# Patient Record
Sex: Female | Born: 1980
Health system: Southern US, Community
[De-identification: ages and names within clinical notes are randomized; demographics above are authoritative.]

## PROBLEM LIST (undated history)

## (undated) DIAGNOSIS — F329 Major depressive disorder, single episode, unspecified: Secondary | ICD-10-CM

## (undated) DIAGNOSIS — F32A Depression, unspecified: Secondary | ICD-10-CM

## (undated) DIAGNOSIS — F419 Anxiety disorder, unspecified: Secondary | ICD-10-CM

## (undated) DIAGNOSIS — R000 Tachycardia, unspecified: Secondary | ICD-10-CM

## (undated) HISTORY — DX: Anxiety disorder, unspecified: F41.9

## (undated) HISTORY — DX: Depression, unspecified: F32.A

## (undated) HISTORY — DX: Tachycardia, unspecified: R00.0

## (undated) HISTORY — DX: Major depressive disorder, single episode, unspecified: F32.9

---

## 2015-11-23 DIAGNOSIS — D1803 Hemangioma of intra-abdominal structures: Secondary | ICD-10-CM

## 2015-11-23 HISTORY — DX: Hemangioma of intra-abdominal structures: D18.03

## 2016-08-12 ENCOUNTER — Encounter: Payer: Self-pay | Admitting: Gastroenterology

## 2016-08-24 ENCOUNTER — Other Ambulatory Visit: Payer: Self-pay

## 2016-08-24 ENCOUNTER — Ambulatory Visit (INDEPENDENT_AMBULATORY_CARE_PROVIDER_SITE_OTHER): Payer: PRIVATE HEALTH INSURANCE | Admitting: Nurse Practitioner

## 2016-08-24 ENCOUNTER — Encounter: Payer: Self-pay | Admitting: Nurse Practitioner

## 2016-08-24 DIAGNOSIS — R197 Diarrhea, unspecified: Secondary | ICD-10-CM | POA: Diagnosis not present

## 2016-08-24 DIAGNOSIS — R1084 Generalized abdominal pain: Secondary | ICD-10-CM

## 2016-08-24 DIAGNOSIS — R112 Nausea with vomiting, unspecified: Secondary | ICD-10-CM | POA: Insufficient documentation

## 2016-08-24 DIAGNOSIS — K625 Hemorrhage of anus and rectum: Secondary | ICD-10-CM

## 2016-08-24 DIAGNOSIS — R109 Unspecified abdominal pain: Secondary | ICD-10-CM | POA: Insufficient documentation

## 2016-08-24 MED ORDER — PEG 3350-KCL-NA BICARB-NACL 420 G PO SOLR: 4000.0000 mL | ORAL | 0 refills | Status: DC

## 2016-08-24 NOTE — Patient Instructions (Signed)
1. We will schedule your procedure for you. 2. We will request your records from [facility]. 3. Return for follow-up in 2 months. 4. Further recommendations to be based on the results of your colonoscopy.

## 2016-08-24 NOTE — Patient Instructions (Signed)
No PA needed for TCS, ref# [id number]

## 2016-08-24 NOTE — Progress Notes (Signed)
Primary Care Physician:  Monico Blitz, MD Primary Gastroenterologist:  Dr. Oneida Alar  Chief Complaint  Patient presents with  . Abdominal Pain    HPI:   Deborah Frost is a 35 y.o. female who presents for referral from primary care for abdominal pain. The patient last saw primary care 08/11/2016 at which point she noted a complaint of vomiting which had a sudden onset and had been occurring for 4 days. This is associated with abdominal pain and diarrhea. Review of systems: Pertinent positives of fatigue and fever as well. In her office she had 100 fever. No prior records and our system.  Today she states she is improved. She was admitted to [facility] and told CT scan didn't show anything. She left the hospital. She had a normal bowel mvoement Tues/Wed last week. Ate dinner a couple days later and had a loose stool and vomiting for one episode which resolved. This cycle has been occurring intermittently for the past several years, typically lasts 2-3 days. This time it has lasted much longer. Her abdominal pain is typically epigastric or bilateral side. Denies hematemesis. Has had some blood in her stools over the past couple years, but not during this current symptomatic episode. Last noted episode was 2 days of bloody stools in 11/2015. States she has had a liver hemangioma on CT. When she is not symptomatic episode she has a bowel movement daily, consistent with [place] 4 and feels like she empties completed. Has noted some weight loss as well (7 lbs objectively in the past 2 weeks based on PCP notes.) Energy level is poor "I never have any energy." Denies chest pain, dyspnea, dizziness, lightheadedness, syncope, near syncope. Denies any other upper or lower GI symptoms.   Denies NSAIDs, ASA powders. Never had a colonoscopy or EGD previously.  Past Medical History:  Diagnosis Date  . Anxiety and depression     No past surgical history on file.  Current Outpatient  Prescriptions  Medication Sig Dispense Refill  . escitalopram (LEXAPRO) 10 MG tablet   12   No current facility-administered medications for this visit.     Allergies as of 08/24/2016  . (No Known Allergies)    Family History  Problem Relation Age of Onset  . Colon cancer Neg Hx   . Inflammatory bowel disease Neg Hx     Social History   Social History  . Marital status: Significant Other    Spouse name: N/A  . Number of children: N/A  . Years of education: N/A   Occupational History  . Not on file.   Social History Main Topics  . Smoking status: Former Smoker    Types: Cigarettes    Start date: 08/17/2016    Quit date: 08/17/2016  . Smokeless tobacco: Never Used  . Alcohol use Yes     Comment: One drink every 2 weeks  . Drug use: No  . Sexual activity: Not on file   Other Topics Concern  . Not on file   Social History Narrative  . No narrative on file    Review of Systems: Complete ROS negative except as per HPI.    Physical Exam: BP 112/74   Pulse 77   Temp 98.7 F (37.1 C) (Oral)   Ht 5\' 6"  (1.676 m)   Wt 128 lb (58.1 kg)   LMP 08/13/2016   BMI 20.66 kg/m  General:   Alert and oriented. Pleasant and cooperative. Well-nourished and well-developed.  Head:  Normocephalic and atraumatic.  Eyes:  Without icterus, sclera clear and conjunctiva pink.  Ears:  Normal auditory acuity. Cardiovascular:  S1, S2 present without murmurs appreciated. Extremities without clubbing or edema. Respiratory:  Clear to auscultation bilaterally. No wheezes, rales, or rhonchi. No distress.  Gastrointestinal:  +BS, soft, non-tender and non-distended. No HSM noted. No guarding or rebound. No masses appreciated.  Rectal:  Deferred  Musculoskalatal:  Symmetrical without gross deformities. Neurologic:  Alert and oriented x4;  grossly normal neurologically. Psych:  Alert and cooperative. Normal mood and affect. Heme/Lymph/Immune: No excessive bruising noted.    08/24/2016  9:32 AM   Disclaimer: This note was dictated with voice recognition software. Similar sounding words can inadvertently be transcribed and may not be corrected upon review.

## 2016-08-27 ENCOUNTER — Encounter: Payer: Self-pay | Admitting: Nurse Practitioner

## 2016-08-27 NOTE — Assessment & Plan Note (Signed)
Intermittent rectal bleeding over the past couple years. Also with abdominal pain associated with diarrhea occurring in cyclical fashion and increased fatigue/decreased energy. Differentials include benign anorectal source, IBD, IBS. Less likely CRC. Will proceed with endoscopic evaluation at this time.  Proceed with colonoscopy with 12.5 mg of preprocedure Phenergan with Dr. Oneida Alar in the near future. The risks, benefits, and alternatives have been discussed in detail with the patient. They state understanding and desire to proceed.   Patient is not on any anticoagulants, anxiolytics, chronic pain medications. She is on daily Lexapro 10 mg. We'll provide for 12.5 mg preprocedure Phenergan to promote adequate sedation.

## 2016-08-27 NOTE — Assessment & Plan Note (Signed)
Abdominal pain, typically epigastric or bilateral sides, associated with nausea and vomiting, diarrhea, decreased energy/increased fatigue, as well as rectal bleeding. The symptoms occur in cycles and have been for some years. Further workup, including colonoscopy, as noted above. Return for follow-up in 2 months.

## 2016-08-27 NOTE — Assessment & Plan Note (Signed)
Loose stools in addition to her abdominal pain, rectal bleeding, decreased energy. Differentials and further workup as per above. Return for follow-up in 2 months.

## 2016-08-27 NOTE — Assessment & Plan Note (Signed)
N/V is included in her symptomatic cycles for some time now. Labs and workup has been completed at [facility] and we will request these records. We will proceed with colonoscopy as noted above. Return for follow-up in 2 months.

## 2016-08-30 NOTE — Progress Notes (Signed)
cc'ed to pcp °

## 2016-09-04 NOTE — Progress Notes (Signed)
REVIEWED-NO ADDITIONAL RECOMMENDATIONS. TCS FOR RECTAL BLEEDING.

## 2016-09-08 ENCOUNTER — Encounter (HOSPITAL_COMMUNITY): Payer: Self-pay

## 2016-09-08 ENCOUNTER — Encounter (HOSPITAL_COMMUNITY)
Admission: RE | Disposition: A | Discharge status: Home / Self Care | Payer: Self-pay | Source: Ambulatory Visit | Attending: Gastroenterology

## 2016-09-08 ENCOUNTER — Ambulatory Visit (HOSPITAL_COMMUNITY)
Admission: RE | Admit: 2016-09-08 | Discharge: 2016-09-08 | Disposition: A | Discharge status: Home / Self Care | Payer: PRIVATE HEALTH INSURANCE | Source: Ambulatory Visit | Attending: Gastroenterology | Admitting: Gastroenterology

## 2016-09-08 DIAGNOSIS — Z79899 Other long term (current) drug therapy: Secondary | ICD-10-CM | POA: Diagnosis not present

## 2016-09-08 DIAGNOSIS — K648 Other hemorrhoids: Secondary | ICD-10-CM | POA: Diagnosis not present

## 2016-09-08 DIAGNOSIS — K529 Noninfective gastroenteritis and colitis, unspecified: Secondary | ICD-10-CM | POA: Insufficient documentation

## 2016-09-08 DIAGNOSIS — F329 Major depressive disorder, single episode, unspecified: Secondary | ICD-10-CM | POA: Insufficient documentation

## 2016-09-08 DIAGNOSIS — K2970 Gastritis, unspecified, without bleeding: Secondary | ICD-10-CM | POA: Insufficient documentation

## 2016-09-08 DIAGNOSIS — R197 Diarrhea, unspecified: Secondary | ICD-10-CM

## 2016-09-08 DIAGNOSIS — R109 Unspecified abdominal pain: Secondary | ICD-10-CM | POA: Diagnosis not present

## 2016-09-08 DIAGNOSIS — K921 Melena: Secondary | ICD-10-CM | POA: Insufficient documentation

## 2016-09-08 DIAGNOSIS — Z87891 Personal history of nicotine dependence: Secondary | ICD-10-CM | POA: Diagnosis not present

## 2016-09-08 DIAGNOSIS — R112 Nausea with vomiting, unspecified: Secondary | ICD-10-CM

## 2016-09-08 DIAGNOSIS — F419 Anxiety disorder, unspecified: Secondary | ICD-10-CM | POA: Insufficient documentation

## 2016-09-08 DIAGNOSIS — K625 Hemorrhage of anus and rectum: Secondary | ICD-10-CM

## 2016-09-08 DIAGNOSIS — Q438 Other specified congenital malformations of intestine: Secondary | ICD-10-CM | POA: Insufficient documentation

## 2016-09-08 HISTORY — DX: Anxiety disorder, unspecified: F41.9

## 2016-09-08 HISTORY — PX: COLONOSCOPY: SHX5424

## 2016-09-08 HISTORY — PX: ESOPHAGOGASTRODUODENOSCOPY: SHX5428

## 2016-09-08 SURGERY — COLONOSCOPY
Anesthesia: Moderate Sedation

## 2016-09-08 MED ORDER — PANTOPRAZOLE SODIUM 40 MG PO TBEC: DELAYED_RELEASE_TABLET | ORAL | 11 refills | Status: DC

## 2016-09-08 MED ORDER — PROMETHAZINE HCL 25 MG/ML IJ SOLN
12.5000 mg | Freq: Once | INTRAMUSCULAR | Status: AC
  Administered 2016-09-08: 12.5 mg via INTRAVENOUS

## 2016-09-08 MED ORDER — SIMETHICONE 40 MG/0.6ML PO SUSP
ORAL | Status: DC | PRN
  Administered 2016-09-08: 2.5 mL

## 2016-09-08 MED ORDER — PROMETHAZINE HCL 25 MG/ML IJ SOLN
INTRAMUSCULAR | Status: AC
  Filled 2016-09-08: qty 1

## 2016-09-08 MED ORDER — MIDAZOLAM HCL 5 MG/5ML IJ SOLN
INTRAMUSCULAR | Status: DC | PRN
  Administered 2016-09-08 (×2): 2 mg via INTRAVENOUS
  Administered 2016-09-08: 1 mg via INTRAVENOUS

## 2016-09-08 MED ORDER — LIDOCAINE VISCOUS 2 % MT SOLN
OROMUCOSAL | Status: AC
  Filled 2016-09-08: qty 15

## 2016-09-08 MED ORDER — PROMETHAZINE HCL 25 MG/ML IJ SOLN
INTRAMUSCULAR | Status: DC | PRN
  Administered 2016-09-08: 12.5 mg via INTRAVENOUS

## 2016-09-08 MED ORDER — SODIUM CHLORIDE 0.9 % IV SOLN
INTRAVENOUS | Status: DC
  Administered 2016-09-08: 12:00:00 via INTRAVENOUS

## 2016-09-08 MED ORDER — MEPERIDINE HCL 100 MG/ML IJ SOLN
INTRAMUSCULAR | Status: AC
  Filled 2016-09-08: qty 2

## 2016-09-08 MED ORDER — MEPERIDINE HCL 100 MG/ML IJ SOLN
INTRAMUSCULAR | Status: DC | PRN
  Administered 2016-09-08 (×2): 25 mg via INTRAVENOUS
  Administered 2016-09-08: 50 mg via INTRAVENOUS

## 2016-09-08 MED ORDER — PAROXETINE HCL 20 MG PO TABS: 20.0000 mg | ORAL_TABLET | Freq: Every day | ORAL | 11 refills | Status: DC

## 2016-09-08 MED ORDER — MIDAZOLAM HCL 5 MG/5ML IJ SOLN
INTRAMUSCULAR | Status: AC
  Filled 2016-09-08: qty 10

## 2016-09-08 MED ORDER — LIDOCAINE VISCOUS 2 % MT SOLN
OROMUCOSAL | Status: DC | PRN
  Administered 2016-09-08: 1 via OROMUCOSAL

## 2016-09-08 MED ORDER — SODIUM CHLORIDE 0.9% FLUSH
INTRAVENOUS | Status: AC
  Filled 2016-09-08: qty 10

## 2016-09-08 NOTE — Op Note (Addendum)
[facility] Patient Name: Deborah Frost Procedure Date: 09/08/2016 2:13 PM MRN: [id number] Date of Birth: 12-09-1980 Attending MD: Barney Drain , MD CSN: [id number] Age: 35 Admit Type: Outpatient Procedure:                Upper GI endoscopy, LIMITED EXAM DUE TO PATIENT                            AGITATION Indications:              Abdominal pain-INTERMITTENT DIARRHE AND NORMAL                            STOOLS AND NAUSEA/VOMITING Providers:                Barney Drain, MD, Charlyne Petrin RN, RN, Isabella Stalling, Technician Referring MD:             Fuller Canada. Manuella Ghazi, MD Medicines:                TCS + Meperidine 25 mg IV, Midazolam 1 mg IV Complications:            No immediate complications. Estimated Blood Loss:     Estimated blood loss: none. Procedure:                Pre-Anesthesia Assessment:                           - Prior to the procedure, a History and Physical                            was performed, and patient medications and                            allergies were reviewed. The patient's tolerance of                            previous anesthesia was also reviewed. The risks                            and benefits of the procedure and the sedation                            options and risks were discussed with the patient.                            All questions were answered, and informed consent                            was obtained. Prior Anticoagulants: The patient has                            taken no previous anticoagulant or antiplatelet  agents. ASA Grade Assessment: II - A patient with                            mild systemic disease. After reviewing the risks                            and benefits, the patient was deemed in                            satisfactory condition to undergo the procedure.                            After obtaining informed consent, the endoscope was                             passed under direct vision. Throughout the                            procedure, the patient's blood pressure, pulse, and                            oxygen saturations were monitored continuously. The                            [id number]) was introduced through the                            mouth, and advanced to the second part of duodenum.                            The upper GI endoscopy was technically difficult                            and complex due to the patient's agitation and the                            patient's combativeness. limited exam due to pt                            agitation in spite of increasing the dose of                            sedation medication. The patient tolerated the                            procedure poorly due to the patient's combativeness. Scope In: 2:16:30 PM Scope Out: 2:19:11 PM Total Procedure Duration: 0 hours 2 minutes 41 seconds  Findings:      The examined esophagus was normal.      Patchy mild inflammation characterized by congestion (edema) and       erythema was found in the gastric antrum.      The examined duodenum was normal. Impression:               -  Normal esophagus.                           - Gastritis.                           - Normal examined duodenum.                           - No OBVIOUS SOURCE FOR ABDOMINAL PAIN,                            NAUSEA/VOMITING OR DIARRHEA IDENTIFIED. IT IS MOST                            LIKELY DUE TO MEDS OR IBS-D. NAUSEA AND VOMITING                            MOST LIKELY DUE TO GERD. Moderate Sedation:      Moderate (conscious) sedation was personally administered by the       endoscopist. The following parameters were monitored: oxygen saturation,       heart rate, blood pressure, and response to care. Total physician       intraservice time was 31 minutes. Recommendation:           - FODMAP DIET.                           - Use Protonix (pantoprazole) 40  mg PO BID. TAPER                            OFF LEXAPRO. START PAXIL 20 MG DAILY/                           - Continue present medications.                           - Repeat upper endoscopy at appointment to be                            scheduled in one month if symptoms not improved                            with PAXIL.                           - Return to GI office in 4 months.                           - Patient has a contact number available for                            emergencies. The signs and symptoms of potential                            delayed complications were discussed with the  patient. Return to normal activities tomorrow.                            Written discharge instructions were provided to the                            patient. Procedure Code(s):        --- Professional ---                           [phone], Esophagogastroduodenoscopy, flexible,                            transoral; diagnostic, including collection of                            specimen(s) by brushing or washing, when performed                            (separate procedure)                           99152, Moderate sedation services provided by the                            same physician or other qualified health care                            professional performing the diagnostic or                            therapeutic service that the sedation supports,                            requiring the presence of an independent trained                            observer to assist in the monitoring of the                            patient's level of consciousness and physiological                            status; initial 15 minutes of intraservice time,                            patient age 9 years or older                           [phone], Moderate sedation services; each additional                            15 minutes intraservice time Diagnosis Code(s):         --- Professional ---  K29.70, Gastritis, unspecified, without bleeding                           R10.9, Unspecified abdominal pain CPT copyright 2016 American Medical Association. All rights reserved. The codes documented in this report are preliminary and upon coder review may  be revised to meet current compliance requirements. Barney Drain, MD Barney Drain, MD 09/08/2016 2:36:44 PM This report has been signed electronically. Number of Addenda: 0

## 2016-09-08 NOTE — Progress Notes (Signed)
Please excuse Deborah Frost from work on Thursday December 28,2017.  She may return to work on Friday December 29th.

## 2016-09-08 NOTE — H&P (Signed)
  Primary Care Physician:  Monico Blitz, MD Primary Gastroenterologist:  Dr. Oneida Alar  Pre-Procedure History & Physical: HPI:  Deborah Frost is a 35 y.o. female here for  BRBPR.  Past Medical History:  Diagnosis Date  . Anxiety   . Anxiety and depression     History reviewed. No pertinent surgical history.  Prior to Admission medications   Medication Sig Start Date End Date Taking? Authorizing Provider  escitalopram (LEXAPRO) 10 MG tablet Take 10 mg by mouth at bedtime.  08/19/16  Yes Historical Provider, MD    Allergies as of 08/24/2016  . (No Known Allergies)    Family History  Problem Relation Age of Onset  . Colon cancer Neg Hx   . Inflammatory bowel disease Neg Hx     Social History   Social History  . Marital status: Significant Other    Spouse name: N/A  . Number of children: N/A  . Years of education: N/A   Occupational History  . Not on file.   Social History Main Topics  . Smoking status: Former Smoker    Types: Cigarettes    Start date: 08/17/2016    Quit date: 08/17/2016  . Smokeless tobacco: Never Used  . Alcohol use Yes     Comment: One drink every 2 weeks  . Drug use: No  . Sexual activity: Not on file   Other Topics Concern  . Not on file   Social History Narrative  . No narrative on file    Review of Systems: See HPI, otherwise negative ROS   Physical Exam: BP (!) 112/57   Pulse 65   Temp 98.8 F (37.1 C) (Oral)   Resp 18   Ht 5\' 6"  (1.676 m)   Wt 128 lb (58.1 kg)   LMP 08/13/2016 (Exact Date)   SpO2 100%   BMI 20.66 kg/m  General:   Alert,  pleasant and cooperative in NAD Head:  Normocephalic and atraumatic. Neck:  Supple; Lungs:  Clear throughout to auscultation.    Heart:  Regular rate and rhythm. Abdomen:  Soft, nontender and nondistended. Normal bowel sounds, without guarding, and without rebound.   Neurologic:  Alert and  oriented x4;  grossly normal neurologically.  Impression/Plan:    BRBPR  PLAN: TCS  TODAY. DISCUSSED PROCEDURE, BENEFITS, & RISKS: < 1% chance of medication reaction, bleeding, perforation, or rupture of spleen/liver.

## 2016-09-08 NOTE — Op Note (Addendum)
[facility] Patient Name: Deborah Frost Procedure Date: 09/08/2016 1:24 PM MRN: [id number] Date of Birth: [birth year] Attending MD: Barney Drain , MD CSN: [id number] Age: 35 Admit Type: Outpatient Procedure:                Colonoscopy WITH RANDOM COLD BIOPSY Indications:              Generalized abdominal pain, Chronic diarrhea,                            Hematochezia Providers:                Barney Drain, MD, Charlyne Petrin RN, RN, Isabella Stalling, Technician Referring MD:             Fuller Canada. Manuella Ghazi, MD Medicines:                Promethazine 25 mg IV, Meperidine 75 mg IV,                            Midazolam 4 mg IV Complications:            No immediate complications. Estimated Blood Loss:     Estimated blood loss was minimal. Procedure:                Pre-Anesthesia Assessment:                           - Prior to the procedure, a History and Physical                            was performed, and patient medications and                            allergies were reviewed. The patient's tolerance of                            previous anesthesia was also reviewed. The risks                            and benefits of the procedure and the sedation                            options and risks were discussed with the patient.                            All questions were answered, and informed consent                            was obtained. Prior Anticoagulants: The patient has                            taken no previous anticoagulant or antiplatelet  agents. ASA Grade Assessment: II - A patient with                            mild systemic disease. After reviewing the risks                            and benefits, the patient was deemed in                            satisfactory condition to undergo the procedure.                            After obtaining informed consent, the colonoscope                            was  passed under direct vision. Throughout the                            procedure, the patient's blood pressure, pulse, and                            oxygen saturations were monitored continuously. The                            [id number]) scope was introduced through                            the anus and advanced to the 10 cm into the ileum.                            The colonoscopy was somewhat difficult due to a                            tortuous colon and the patient's agitation.                            Successful completion of the procedure was aided by                            increasing the dose of sedation medication,                            straightening and shortening the scope to obtain                            bowel loop reduction and COLOWRAP. The patient                            tolerated the procedure fairly well. The quality of                            the bowel preparation was excellent. The terminal  ileum, ileocecal valve, appendiceal orifice, and                            rectum were photographed. Scope In: 1:56:26 PM Scope Out: 2:10:23 PM Scope Withdrawal Time: 0 hours 10 minutes 31 seconds  Total Procedure Duration: 0 hours 13 minutes 57 seconds  Findings:      The terminal ileum appeared normal.      The recto-sigmoid colon and sigmoid colon were moderately redundant.       Biopsies for histology were taken with a cold forceps from the cecum,       ascending colon, transverse colon and descending colon for evaluation of       microscopic colitis.      Internal hemorrhoids were found. The hemorrhoids were moderate. Impression:               - The examined portion of the ileum was normal.                           - Redundant LEFT colon.                           - RECTAL BLEEDING DUE TO Internal hemorrhoids.                           -NO SOURCE FOR ABDOMINAL PAIN, NAUSEA, VOMITING, OR                             DIARRHEA IDENTIFIED. IT IS MOST LIKELY DUE TO MEDS                            OR IBS-D. Moderate Sedation:      Moderate (conscious) sedation was administered by the endoscopy nurse       and supervised by the endoscopist. The following parameters were       monitored: oxygen saturation, heart rate, blood pressure, and response       to care. Total physician intraservice time was 31 minutes. Recommendation:           - LOW FODMAP DIET.                           - TAPER OFF LEXAPRO. START PAXIL 20 MG DAILY.                           - Await pathology results.                           - Repeat colonoscopy in 10 years for surveillance.                            PROCEED TO EGD.                           - Return to GI office in 4 months.                           - Patient has a  contact number available for                            emergencies. The signs and symptoms of potential                            delayed complications were discussed with the                            patient. Return to normal activities tomorrow.                            Written discharge instructions were provided to the                            patient. Procedure Code(s):        --- Professional ---                           [id number], Colonoscopy, flexible; with biopsy, single                            or multiple                           99152, Moderate sedation services provided by the                            same physician or other qualified health care                            professional performing the diagnostic or                            therapeutic service that the sedation supports,                            requiring the presence of an independent trained                            observer to assist in the monitoring of the                            patient's level of consciousness and physiological                            status; initial 15 minutes of intraservice time,                             patient age 32 years or older                           [phone], Moderate sedation services; each additional  15 minutes intraservice time Diagnosis Code(s):        --- Professional ---                           K64.8, Other hemorrhoids                           R10.84, Generalized abdominal pain                           K52.9, Noninfective gastroenteritis and colitis,                            unspecified                           K92.1, Melena (includes Hematochezia)                           Q43.8, Other specified congenital malformations of                            intestine CPT copyright 2016 American Medical Association. All rights reserved. The codes documented in this report are preliminary and upon coder review may  be revised to meet current compliance requirements. Barney Drain, MD Barney Drain, MD 09/08/2016 2:29:42 PM This report has been signed electronically. Number of Addenda: 0

## 2016-09-08 NOTE — Discharge Instructions (Signed)
YOUR EXAM TODAY REVEALED gastritis, & internal hemorrhoids.YOUR SYMPTOMS ARE MOST LIKELY DUE TO IBS, medication, and/or reflux.  YOUR RECTAL BLEEDING IS DUE TO HEMORRHOIDS.  I BIOPSIED YOUR COLON. I could not complete the upper endoscopy because your were agitated in spite of adequate sedation.   FOLLOW A LOW FAT DIET. AVOID ITEMS THAT CAUSE BLOATING. SEE HANDOUT.  Take Lexapro every other day for 7 days THEN STOP AND START PAXIL 20 MG DAILY.  START PROTONIX. TAKE 30 MINUTES PRIOR TO MEALS TWICE DAILY FOR 3 MOS THEN ONCE DAILY.  YOUR BIOPSY RESULTS WILL BE AVAILABLE IN MY CHART AFTER DEC 30 AND MY OFFICE WILL CONTACT YOU IN 10-14 DAYS WITH YOUR RESULTS.   PLEASE CALL IN ONE MONTH IF YOUR SYMPTOMS ARE NOT BETTER. IF NOT YOU WILL NEED AND UPPER ENDOSCOPY WITH PROPOFOL.  FOLLOW UP IN 4 MOS.  Next colonoscopy AT AGE 46.   ENDOSCOPY Care After Read the instructions outlined below and refer to this sheet in the next week. These discharge instructions provide you with general information on caring for yourself after you leave the hospital. While your treatment has been planned according to the most current medical practices available, unavoidable complications occasionally occur. If you have any problems or questions after discharge, call DR. FIELDS, [phone].  ACTIVITY  You may resume your regular activity, but move at a slower pace for the next 24 hours.   Take frequent rest periods for the next 24 hours.   Walking will help get rid of the air and reduce the bloated feeling in your belly (abdomen).   No driving for 24 hours (because of the medicine (anesthesia) used during the test).   You may shower.   Do not sign any important legal documents or operate any machinery for 24 hours (because of the anesthesia used during the test).    NUTRITION  Drink plenty of fluids.   You may resume your normal diet as instructed by your doctor.   Begin with a light meal and progress to  your normal diet. Heavy or fried foods are harder to digest and may make you feel sick to your stomach (nauseated).   Avoid alcoholic beverages for 24 hours or as instructed.    MEDICATIONS  You may resume your normal medications.   WHAT YOU CAN EXPECT TODAY  Some feelings of bloating in the abdomen.   Passage of more gas than usual.   Spotting of blood in your stool or on the toilet paper  .  IF YOU HAD POLYPS REMOVED DURING THE ENDOSCOPY:  Eat a soft diet IF YOU HAVE NAUSEA, BLOATING, ABDOMINAL PAIN, OR VOMITING.    FINDING OUT THE RESULTS OF YOUR TEST Not all test results are available during your visit. DR. Oneida Alar WILL CALL YOU WITHIN 14 DAYS OF YOUR PROCEDUE WITH YOUR RESULTS. Do not assume everything is normal if you have not heard from DR. FIELDS, CALL HER OFFICE AT [phone].  SEEK IMMEDIATE MEDICAL ATTENTION AND CALL THE OFFICE: [phone] IF:  You have more than a spotting of blood in your stool.   Your belly is swollen (abdominal distention).   You are nauseated or vomiting.   You have a temperature over 101F.   You have abdominal pain or discomfort that is severe or gets worse throughout the day.  Irritable Bowel Syndrome (Spastic Colon) Irritable Bowel Syndrome (IBS) is caused by a disturbance of normal bowel function. Other terms used are spastic colon, mucous colitis, and irritable colon. It  does not require surgery, nor does it lead to cancer. There is no cure for IBS. But with proper diet, stress reduction, and medication, you will find that your problems (symptoms) will gradually disappear or improve. IBS is a common digestive disorder. Women develop it twice as often as men.  CAUSES After food has been digested and absorbed in the small intestine, waste material is moved into the colon (large intestine). In the colon, water and salts are absorbed from the undigested products coming from the small intestine. The remaining residue, or fecal material,  is held for elimination. Under normal circumstances, gentle, rhythmic contractions on the bowel walls push the fecal material along the colon towards the rectum. In IBS, however, these contractions are irregular and poorly coordinated. The fecal material is either retained too long, resulting in constipation, or expelled too soon, producing diarrhea.  SYMPTOMS  The most common symptom of IBS is pain. It is typically in the lower left side of the belly (abdomen). But it may occur anywhere in the abdomen. It can be felt as heartburn, backache, or even as a dull pain in the arms or shoulders. The pain comes from excessive bowel-muscle spasms and from the buildup of gas and fecal material in the colon. This pain:  Can range from sharp belly (abdominal) cramps to a dull, continuous ache.   Usually worsens soon after eating.   Is typically relieved by having a bowel movement or passing gas.  Abdominal pain is usually accompanied by constipation. But it may also produce diarrhea. The diarrhea typically occurs right after a meal or upon arising in the morning. The stools are typically soft and watery. They are often flecked with secretions (mucus).  Other symptoms of IBS include:  Bloating.  Loss of appetite.   Heartburn.  Feeling sick to your stomach  (nausea).   Belching  Vomiting   Gas.  IBS may also cause a number of symptoms that are unrelated to the digestive system:  Fatigue.  Headaches.   Anxiety  Shortness of breath   Difficulty in concentrating.  Dizziness.   These symptoms tend to come and go. TREATMENT  A number of medications are available to help correct bowel function and/or relieve bowel spasms and abdominal pain. Among the drugs available are:  Mild, non-irritating laxatives(MIRALAX) for constipation and to help restore normal bowel habits.   Specific anti-diarrheal medications to treat severe or prolonged diarrhea.   Anti-spasmodic agents to relieve intestinal  cramps (Bentyl).   PROBIOTICS  HOME CARE INSTRUCTIONS   Avoid foods that are high in fat or oils. Some examples [id number] cream, butter, frankfurters, sausage, and other fatty meats.   MINIMIZE foods that have a laxative effect, such as fruit, fruit juice, and dairy products.   Cut out carbonated drinks, chewing gum, and "gassy" foods, such as beans and cabbage. This may help relieve bloating and belching.   Bran taken with plenty of liquids may help relieve constipation.   Keep track of what foods seem to trigger your symptoms.   Avoid emotionally charged situations or circumstances that produce anxiety.   Start or continue exercising.   Get plenty of rest and sleep.   Gastritis  Gastritis is an inflammation (the body's way of reacting to injury and/or infection) of the stomach. It is often caused by viral or bacterial (germ) infections. It can also be caused BY ASPIRIN, BC/GOODY POWDER'S, (IBUPROFEN) MOTRIN, OR ALEVE (NAPROXEN), chemicals (including alcohol), SPICY FOODS, and medications. This illness may be associated with  generalized malaise (feeling tired, not well), UPPER ABDOMINAL STOMACH cramps, and fever. One common bacterial cause of gastritis is an organism known as H. Pylori. This can be treated with antibiotics.   Hemorrhoids Hemorrhoids are dilated (enlarged) veins around the rectum. Sometimes clots will form in the veins. This makes them swollen and painful. These are called thrombosed hemorrhoids. Causes of hemorrhoids include:  Constipation.   Straining to have a bowel movement.   HEAVY LIFTING   HOME CARE INSTRUCTIONS  Eat a well balanced diet and drink 6 to 8 glasses of water every day to avoid constipation. You may also use a bulk laxative.   Avoid straining to have bowel movements.   Keep anal area dry and clean.   Do not use a donut shaped pillow or sit on the toilet for long periods. This increases blood pooling and pain.   Move your bowels when  your body has the urge; this will require less straining and will decrease pain and pressure.

## 2016-09-13 ENCOUNTER — Telehealth: Payer: Self-pay | Admitting: Gastroenterology

## 2016-09-13 NOTE — Telephone Encounter (Signed)
Please call pt. Her colon biopsies are normal.     FOLLOW A LOW FAT DIET. AVOID ITEMS THAT CAUSE BLOATING.   Take Lexapro every other day for 7 days THEN STOP AND START PAXIL 20 MG DAILY.  START PROTONIX. TAKE 30 MINUTES PRIOR TO MEALS TWICE DAILY FOR 3 MOS THEN ONCE DAILY.  PLEASE CALL IN ONE MONTH IF YOUR SYMPTOMS ARE NOT BETTER. IF NOT YOU WILL NEED AND UPPER ENDOSCOPY WITH PROPOFOL.  FOLLOW UP IN 4 MOS E30 ABDOMINAL PAIN/DIARRHEA/VOMITING.

## 2016-09-14 ENCOUNTER — Encounter (HOSPITAL_COMMUNITY): Payer: Self-pay | Admitting: Gastroenterology

## 2016-09-14 NOTE — Telephone Encounter (Signed)
Pt is aware.  

## 2016-10-11 ENCOUNTER — Telehealth: Payer: Self-pay | Admitting: General Practice

## 2016-10-11 NOTE — Telephone Encounter (Signed)
Patient called in stating her insurance will not cover Protonix 40mg  twice a day.  She would like for you to know if you could give her something else

## 2016-10-11 NOTE — Telephone Encounter (Signed)
Routing to Dr. Fields.  

## 2016-10-12 NOTE — Telephone Encounter (Signed)
Please check with her pharmacy to see what's covered.

## 2016-10-14 NOTE — Telephone Encounter (Signed)
Per Diane at the pharmacy it was approved.

## 2016-10-14 NOTE — Telephone Encounter (Signed)
I called [facility] in [place] and spoke to [place]. She said they just need PA for the insurance to cover bid Protonix. She is faxing the paperwork over. Forwarding to Oneida.

## 2016-10-14 NOTE — Telephone Encounter (Signed)
PA was done yesterday. Waiting to hear from insurance.

## 2016-10-25 ENCOUNTER — Ambulatory Visit: Payer: PRIVATE HEALTH INSURANCE | Admitting: Nurse Practitioner

## 2017-01-07 ENCOUNTER — Encounter: Payer: Self-pay | Admitting: Gastroenterology

## 2017-01-07 ENCOUNTER — Ambulatory Visit (INDEPENDENT_AMBULATORY_CARE_PROVIDER_SITE_OTHER): Payer: PRIVATE HEALTH INSURANCE | Admitting: Gastroenterology

## 2017-01-07 VITALS — BP 116/73 | HR 98 | Temp 98.8°F | Ht 66.0 in | Wt 144.8 lb

## 2017-01-07 DIAGNOSIS — K2950 Unspecified chronic gastritis without bleeding: Secondary | ICD-10-CM

## 2017-01-07 DIAGNOSIS — K2970 Gastritis, unspecified, without bleeding: Secondary | ICD-10-CM | POA: Insufficient documentation

## 2017-01-07 NOTE — Progress Notes (Signed)
    Referring Provider: Monico Blitz, MD Primary Care Physician:  Monico Blitz, MD Primary GI: Dr. Oneida Alar   Chief Complaint  Patient presents with  . Abdominal Pain    f/u, doing ok    HPI:   Deborah Frost is a 36 y.o. female presenting today with a history of abdominal pain, gastritis, diarrhea, with EGD/TCS on file. Presenting for routine follow-up.   Doing well but avoiding any salads, greens, etc. If eating these, stomach would hurt, vomit, and then have diarrhea. Unable to eat tossed salad, bowl of turnip greens, [employer] pizza. Protonix once daily. Taking Paxil. No diarrhea since December 2017. Getting married in 2 years on leap year.   Past Medical History:  Diagnosis Date  . Anxiety   . Anxiety and depression     Past Surgical History:  Procedure Laterality Date  . COLONOSCOPY N/A 09/08/2016   Dr. Oneida Alar: internal hemorrhoids, benign colonic mucosa  . ESOPHAGOGASTRODUODENOSCOPY N/A 09/08/2016   Dr. Oneida Alar: normal esophagus, gastritis, normal examined duodenum    Current Outpatient Prescriptions  Medication Sig Dispense Refill  . pantoprazole (PROTONIX) 40 MG tablet 1 PO 30 MINUTES PRIOR TO MEALS BID FOR 3 MOS THEN QD (Patient taking differently: Take 40 mg by mouth daily. 1 PO 30 MINUTES PRIOR TO MEALS BID FOR 3 MOS THEN QD. Taking once a day.) 60 tablet 11  . PARoxetine (PAXIL) 20 MG tablet Take 1 tablet (20 mg total) by mouth daily. 30 tablet 11   No current facility-administered medications for this visit.     Allergies as of 01/07/2017  . (No Known Allergies)    Family History  Problem Relation Age of Onset  . Colon cancer Neg Hx   . Inflammatory bowel disease Neg Hx     Social History   Social History  . Marital status: Significant Other    Spouse name: N/A  . Number of children: N/A  . Years of education: N/A   Social History Main Topics  . Smoking status: Current Every Day Smoker    Types: Cigarettes    Start date: 08/17/2016   Last attempt to quit: 08/17/2016  . Smokeless tobacco: Never Used  . Alcohol use Yes     Comment: One drink every 2 weeks  . Drug use: No  . Sexual activity: Not Asked   Other Topics Concern  . None   Social History Narrative  . None    Review of Systems: As mentioned in HPI.   Physical Exam: BP 116/73   Pulse 98   Temp 98.8 F (37.1 C) (Oral)   Ht 5\' 6"  (1.676 m)   Wt 144 lb 12.8 oz (65.7 kg)   BMI 23.37 kg/m  General:   Alert and oriented. No distress noted. Pleasant and cooperative.  Head:  Normocephalic and atraumatic. Eyes:  Conjuctiva clear without scleral icterus. Mouth:  Oral mucosa pink and moist. Good dentition. No lesions. Abdomen:  +BS, soft, non-tender and non-distended. No rebound or guarding. No HSM or masses noted. Msk:  Symmetrical without gross deformities. Normal posture. Extremities:  Without edema. Neurologic:  Alert and  oriented x4;  grossly normal neurologically. Psych:  Alert and cooperative. Normal mood and affect.

## 2017-01-07 NOTE — Progress Notes (Signed)
cc'd to pcp 

## 2017-01-07 NOTE — Assessment & Plan Note (Signed)
Doing well on Protonix once daily. No abdominal pain or diarrhea unless eating leafy greens. Diarrhea resolved. Continue Protonix, continue dietary and behavior modification, return in 1 year.

## 2017-01-07 NOTE — Patient Instructions (Signed)
Continue Protonix once daily, 30 minutes before breakfast.   We will see you back in 1 year.

## 2017-02-03 NOTE — Progress Notes (Signed)
REVIEWED-NO ADDITIONAL RECOMMENDATIONS. 

## 2017-06-14 DIAGNOSIS — Z01419 Encounter for gynecological examination (general) (routine) without abnormal findings: Secondary | ICD-10-CM | POA: Diagnosis not present

## 2017-09-12 ENCOUNTER — Other Ambulatory Visit: Payer: Self-pay | Admitting: Gastroenterology

## 2017-09-14 ENCOUNTER — Other Ambulatory Visit: Payer: Self-pay

## 2017-09-20 ENCOUNTER — Other Ambulatory Visit: Payer: Self-pay | Admitting: Gastroenterology

## 2017-10-14 DIAGNOSIS — R509 Fever, unspecified: Secondary | ICD-10-CM | POA: Diagnosis not present

## 2017-10-14 DIAGNOSIS — Z299 Encounter for prophylactic measures, unspecified: Secondary | ICD-10-CM | POA: Diagnosis not present

## 2017-10-14 DIAGNOSIS — R599 Enlarged lymph nodes, unspecified: Secondary | ICD-10-CM | POA: Diagnosis not present

## 2017-10-14 DIAGNOSIS — J101 Influenza due to other identified influenza virus with other respiratory manifestations: Secondary | ICD-10-CM | POA: Diagnosis not present

## 2017-10-14 DIAGNOSIS — Z6821 Body mass index (BMI) 21.0-21.9, adult: Secondary | ICD-10-CM | POA: Diagnosis not present

## 2017-10-14 DIAGNOSIS — R69 Illness, unspecified: Secondary | ICD-10-CM | POA: Diagnosis not present

## 2017-10-24 DIAGNOSIS — R69 Illness, unspecified: Secondary | ICD-10-CM | POA: Diagnosis not present

## 2017-10-24 DIAGNOSIS — R5383 Other fatigue: Secondary | ICD-10-CM | POA: Diagnosis not present

## 2017-10-24 DIAGNOSIS — Z1331 Encounter for screening for depression: Secondary | ICD-10-CM | POA: Diagnosis not present

## 2017-10-24 DIAGNOSIS — Z299 Encounter for prophylactic measures, unspecified: Secondary | ICD-10-CM | POA: Diagnosis not present

## 2017-10-24 DIAGNOSIS — Z0000 Encounter for general adult medical examination without abnormal findings: Secondary | ICD-10-CM | POA: Diagnosis not present

## 2017-11-09 ENCOUNTER — Encounter: Payer: Self-pay | Admitting: Gastroenterology

## 2018-05-17 DIAGNOSIS — L723 Sebaceous cyst: Secondary | ICD-10-CM | POA: Diagnosis not present

## 2018-06-01 DIAGNOSIS — Z23 Encounter for immunization: Secondary | ICD-10-CM | POA: Diagnosis not present

## 2018-06-08 DIAGNOSIS — L089 Local infection of the skin and subcutaneous tissue, unspecified: Secondary | ICD-10-CM | POA: Diagnosis not present

## 2018-06-08 DIAGNOSIS — L02214 Cutaneous abscess of groin: Secondary | ICD-10-CM | POA: Diagnosis not present

## 2018-06-08 DIAGNOSIS — L732 Hidradenitis suppurativa: Secondary | ICD-10-CM | POA: Diagnosis not present

## 2018-09-08 ENCOUNTER — Other Ambulatory Visit: Payer: Self-pay | Admitting: Gastroenterology

## 2018-09-11 ENCOUNTER — Telehealth: Payer: Self-pay | Admitting: Gastroenterology

## 2018-09-11 ENCOUNTER — Other Ambulatory Visit: Payer: Self-pay

## 2018-09-11 NOTE — Telephone Encounter (Signed)
Completed.

## 2018-09-11 NOTE — Telephone Encounter (Signed)
Deborah Frost, please note, this is in refill box.

## 2018-09-11 NOTE — Telephone Encounter (Signed)
Pt is aware.  

## 2018-09-11 NOTE — Telephone Encounter (Signed)
Pt said she called last week and was to call her pharmacy to send [place] a refill request on her Paxil. She said the pharmacy hasn't received anything from [place] yet and she is down to her last pill. Please advise. [phone]

## 2018-10-30 DIAGNOSIS — R69 Illness, unspecified: Secondary | ICD-10-CM | POA: Diagnosis not present

## 2018-10-30 DIAGNOSIS — Z299 Encounter for prophylactic measures, unspecified: Secondary | ICD-10-CM | POA: Diagnosis not present

## 2018-10-30 DIAGNOSIS — Z0000 Encounter for general adult medical examination without abnormal findings: Secondary | ICD-10-CM | POA: Diagnosis not present

## 2018-10-30 DIAGNOSIS — Z79899 Other long term (current) drug therapy: Secondary | ICD-10-CM | POA: Diagnosis not present

## 2018-10-30 DIAGNOSIS — Z6822 Body mass index (BMI) 22.0-22.9, adult: Secondary | ICD-10-CM | POA: Diagnosis not present

## 2018-10-30 DIAGNOSIS — Z1331 Encounter for screening for depression: Secondary | ICD-10-CM | POA: Diagnosis not present

## 2018-10-30 DIAGNOSIS — E7800 Pure hypercholesterolemia, unspecified: Secondary | ICD-10-CM | POA: Diagnosis not present

## 2018-11-08 DIAGNOSIS — N6322 Unspecified lump in the left breast, upper inner quadrant: Secondary | ICD-10-CM | POA: Diagnosis not present

## 2018-11-08 DIAGNOSIS — N6002 Solitary cyst of left breast: Secondary | ICD-10-CM | POA: Diagnosis not present

## 2018-11-08 DIAGNOSIS — R922 Inconclusive mammogram: Secondary | ICD-10-CM | POA: Diagnosis not present

## 2018-11-08 DIAGNOSIS — N6321 Unspecified lump in the left breast, upper outer quadrant: Secondary | ICD-10-CM | POA: Diagnosis not present

## 2018-11-13 ENCOUNTER — Ambulatory Visit: Payer: Self-pay | Admitting: Gastroenterology

## 2018-11-14 NOTE — Progress Notes (Deleted)
history of abdominal pain, gastritis, diarrhea, with EGD/TCS on file from Dec 2017.

## 2018-11-17 ENCOUNTER — Ambulatory Visit: Payer: Self-pay | Admitting: Gastroenterology

## 2019-01-16 ENCOUNTER — Other Ambulatory Visit: Payer: Self-pay

## 2019-01-16 ENCOUNTER — Encounter: Payer: Self-pay | Admitting: Gastroenterology

## 2019-01-16 ENCOUNTER — Ambulatory Visit: Payer: Self-pay | Admitting: Gastroenterology

## 2019-01-16 DIAGNOSIS — K2930 Chronic superficial gastritis without bleeding: Secondary | ICD-10-CM

## 2019-01-16 DIAGNOSIS — R112 Nausea with vomiting, unspecified: Secondary | ICD-10-CM

## 2019-01-16 MED ORDER — PANTOPRAZOLE SODIUM 40 MG PO TBEC: DELAYED_RELEASE_TABLET | ORAL | 11 refills | Status: DC

## 2019-01-16 MED ORDER — PAROXETINE HCL 20 MG PO TABS: 20.0000 mg | ORAL_TABLET | Freq: Every day | ORAL | 3 refills | Status: DC

## 2019-01-16 NOTE — Progress Notes (Signed)
   Subjective:    Patient ID: Deborah Frost, female    DOB: 12-14-1980, 38 y.o.   MRN: [id number]   Primary Care Physician:  Monico Blitz, MD  Primary GI:  Barney Drain, MD   Patient Location: home   Provider Location: [facility] office   Reason for Visit: NAUSEA/VOMITING   Persons present on the virtual encounter, with roles: patient, myself (provider), MARTINA BOOTH CMA (update meds/allergies)   Total time (minutes) spent on medical discussion:   15 MINUTES   Due to COVID-19, visit was VIA TELEPHONE VISIT DUE TO COVID 19. VISIT IS CONDUCTED VIRTUALLY AND WAS REQUESTED BY PATIENT.   Virtual Visit via TELEPHONE   I connected with Deborah Frost and verified that I am speaking with the correct person using two identifiers.   I discussed the limitations, risks, security and privacy concerns of performing an evaluation and management service by telephone/video and the availability of in person appointments. I also discussed with the patient that there may be a patient responsible charge related to this service. The patient expressed understanding and agreed to proceed.  HPI RARE EPISODE OF NAUSEA/VMITING. LAST EPISODE LAST WEEK WAS AFTER EATING [employer] PIZZA. SHE HAD DIARRHEA AND VOMITING. DENIES HEARTBURN PRIOR TO EPISODE. THE NEXT DAY SHE ATE A BOWL OF CEREAL WITH REGULAR MILK AND HAD DIARRHEA. EPISODE ARE ASSOCIATED WITH A TWISTING  ABDOMINAL PAIN FOR ~1-2 HRS. SHE IS BETTER SINCE LAST OPV. EPISODES ARE NOW HAPPENING EVERY 3-4 MOS. SHE NEEDS A REFILL OF PAXIL/PROTONIX.  PT DENIES FEVER, CHILLS, HEMATOCHEZIA, HEMATEMESIS, melena, CHEST PAIN, SHORTNESS OF BREATH,  CHANGE IN BOWEL IN HABITS, constipation, problems swallowing, OR heartburn or indigestion.   Current Outpatient Medications  Medication Sig    . loratadine (CLARITIN) 10 MG tablet Take 10 mg by mouth daily.    . Omega-3 Fatty Acids (FISH OIL) 1000 MG CAPS Take by mouth daily.    Marland Kitchen PARoxetine (PAXIL) 20 MG tablet  TAKE 1 TABLET BY MOUTH ONCE DAILY    .       Review of Systems PER HPI OTHERWISE ALL SYSTEMS ARE NEGATIVE.    Objective:   Physical Exam  TELEPHONE VISIT DUE TO COVID 19, VISIT IS CONDUCTED VIRTUALLY AND WAS REQUESTED BY PATIENT.     Assessment & Plan:

## 2019-01-16 NOTE — Assessment & Plan Note (Signed)
SYMPTOMS FAIRLY WELL CONTROLLED-ONE EPISODE EVERY 3-4 MOS.  AVOID TRIGGERS FOR GASTRITIS.  HANDOUT GIVEN. USE PROTONIX 30 MINUTES PRIOR TO MEALS UP TO TWO TIMES A DAY IF YOUR EAT SOMETHING THAT MAY TRIGGER GASTRITIS. REFILLED FOR ONE ONE YEAR. CONTINUE PAXIL. REFILLED FOR ONE ONE YEAR. FOLLOW UP IN 6 MOS TO 1 YEAR.

## 2019-01-16 NOTE — Patient Instructions (Signed)
AVOID TRIGGERS FOR GASTRITIS. SEE INFO BELOW.  USE PROTONIX 30 MINUTES PRIOR TO MEALS UP TO TWO TIMES A DAY IF YOUR EAT SOMETHING THAT MAY TRIGGER GASTRITIS. IT WAS REFILLED FOR ONE ONE YEAR.  CONTINUE PAXIL. IT WAS REFILLED FOR ONE ONE YEAR.  FOLLOW UP IN 6 MOS TO 1 YEAR.  Gastritis  Gastritis is an inflammation (the body's way of reacting to injury and/or infection) of the stomach. It is often caused by viral or bacterial (germ) infections. It can also be caused BY ASPIRIN, BC/GOODY POWDER'S, (IBUPROFEN) MOTRIN, OR ALEVE (NAPROXEN), chemicals (including alcohol), SPICY FOODS, and medications. This illness may be associated with generalized malaise (feeling tired, not well), UPPER ABDOMINAL STOMACH cramps, and fever. One common bacterial cause of gastritis is an organism known as H. Pylori. This can be treated with antibiotics.

## 2019-01-17 ENCOUNTER — Encounter: Payer: Self-pay | Admitting: Gastroenterology

## 2019-01-17 NOTE — Progress Notes (Signed)
ON RECALL  °

## 2019-01-17 NOTE — Assessment & Plan Note (Signed)
MOST LIKELY DUE TO GASTRITIS OR GERD.  AVOID TRIGGERS FOR GASTRITIS.  HANDOUT GIVEN. USE PROTONIX 30 MINUTES PRIOR TO MEALS UP TO TWO TIMES A DAY IF YOUR EAT SOMETHING THAT MAY TRIGGER GASTRITIS. IT WAS REFILLED FOR ONE ONE YEAR. CONTINUE PAXIL. IT WAS REFILLED FOR ONE ONE YEAR. FOLLOW UP IN 6 MOS TO 1 YEAR.

## 2019-01-18 ENCOUNTER — Telehealth: Payer: Self-pay | Admitting: Gastroenterology

## 2019-01-18 ENCOUNTER — Encounter: Payer: Self-pay | Admitting: Gastroenterology

## 2019-01-18 MED ORDER — PROMETHAZINE HCL 12.5 MG PO TABS: ORAL_TABLET | ORAL | 0 refills | Status: DC

## 2019-01-18 NOTE — Progress Notes (Signed)
CC'D TO PCP °

## 2019-01-18 NOTE — Progress Notes (Signed)
ON RECALL  °

## 2019-01-18 NOTE — Telephone Encounter (Signed)
PLEASE CALL PT.  Rx sent FOR PHENERGAN TO [facility]. DRINK WATER TO KEEP YOUR URINE LIGHT YELLOW. FOLLOW A CLEAR LIQUID DIET FOR 24 HRS. GO TO ED IF UNABLE TO KEEP DOWN FLUIDS FOR 24 HRS.

## 2019-01-18 NOTE — Telephone Encounter (Signed)
Spoke with pt, very vomiting and abdominal pain started last night after eating at 9:00 PM. Pt states her husband and her ate the same thing and he is not having any problems. Pt's pain is around 5 and can get to a 9. Pt would like something to take for nausea and vomiting.

## 2019-01-18 NOTE — Addendum Note (Signed)
Addended by: Danie Binder on: 01/18/2019 01:27 PM   Modules accepted: Orders

## 2019-01-18 NOTE — Telephone Encounter (Signed)
Pt had virtual visit with SF on 5/5 and today is having bad abdominal pain with nausea and vomiting, no fever. She wanted to know if SF could call something into [facility]. Please advise. [phone]

## 2019-01-18 NOTE — Telephone Encounter (Signed)
Left a detailed message on machine. Pt is aware of Sf's recommendations.

## 2019-01-18 NOTE — Telephone Encounter (Signed)
Left a message on pts machine to get some more information. Will discuss further with pt when she calls.  Routing to [facility].

## 2019-01-19 ENCOUNTER — Ambulatory Visit: Payer: Self-pay | Admitting: Gastroenterology

## 2019-02-19 DIAGNOSIS — Z6823 Body mass index (BMI) 23.0-23.9, adult: Secondary | ICD-10-CM | POA: Diagnosis not present

## 2019-02-19 DIAGNOSIS — Z713 Dietary counseling and surveillance: Secondary | ICD-10-CM | POA: Diagnosis not present

## 2019-02-19 DIAGNOSIS — Z299 Encounter for prophylactic measures, unspecified: Secondary | ICD-10-CM | POA: Diagnosis not present

## 2019-02-19 DIAGNOSIS — M25512 Pain in left shoulder: Secondary | ICD-10-CM | POA: Diagnosis not present

## 2019-04-09 ENCOUNTER — Telehealth: Payer: Self-pay

## 2019-04-09 NOTE — Telephone Encounter (Signed)
PLEASE CALL PT. GREAT THAT SHE IS DOING THOSE THINGS BUT SHE ATE A HAM AND CHEESE SANDWICH AND COOKIES WHICH ARE HIGH IN FAT. SHE SHOULD AVOID THEM IN THE FUTURE.

## 2019-04-09 NOTE — Telephone Encounter (Signed)
PT is aware. Said she is already doing all of those things.

## 2019-04-09 NOTE — Telephone Encounter (Signed)
Pt left Vm that she is taking the Protonix and needs something else. I called to discuss with her and Left Vm for a return call.  Call back [phone].

## 2019-04-09 NOTE — Telephone Encounter (Signed)
PT called and said she has been taking the Protonix once a day since May. She has had one or two episodes a month with getting sick and vomiting. Last time was last Wed she had a ham and cheese sub and 2 cookies and early Thursday morning she woke up sick on her stomach and vomiting and diarrhea.  Yesterday was the first day she has eaten a full meal since the episode and she has been good so far. Dr. Oneida Alar, please advise!

## 2019-04-09 NOTE — Telephone Encounter (Signed)
PLEASE CALL PT. She has reflux and should AVOID REFLUX TRIGGERS. IF SHE EATS FOOD THAT TRIGGERS REFLUX SHE WILL HAVE VOMITING. A ham and cheese sub WITH 2 cookies WILL DEFNITELY TRIGGER HEARTBURN/REFLUX/VOMITING.    TO CONTROL HEARTBURN:   1. DRINK WATER TO KEEP YOUR URINE LIGHT YELLOW.    2. MAINTAIN A NORMAL BODY WEIGHT.    3. Avoid reflux triggers.     5. STRICTLY FOLLOW A LOW FAT DIET. SEE INFO BELOW.    6. CONTINUE PROTONIX. TAKE 30 MINUTES PRIOR TO BREAKFAST ONCE  DAILY.    7. USE PEPCID OR TAGAMET FOR BREAKTHROUGH HEARTBURN/REFLUX.  Lifestyle and home remedies TO CONTROL HEARTBURN You may eliminate or reduce the frequency of heartburn by making the following lifestyle changes:  . Control your weight. Being overweight is a major risk factor for heartburn and GERD. Excess pounds put pressure on your abdomen, pushing up your stomach and causing acid to back up into your esophagus.   . Eat smaller meals. 4 TO 6 MEALS A DAY. This reduces pressure on the lower esophageal sphincter, helping to prevent the valve from opening and acid from washing back into your esophagus.   Dolphus Jenny your belt. Clothes that fit tightly around your waist put pressure on your abdomen and the lower esophageal sphincter.   . Eliminate heartburn triggers. Everyone has specific triggers. Common triggers such as fatty or fried foods, spicy food, tomato sauce, carbonated beverages, alcohol, chocolate, mint, garlic, onion, caffeine and nicotine may make heartburn worse.   Marland Kitchen Avoid stooping or bending. Tying your shoes is OK. Bending over for longer periods to weed your garden isn't, especially soon after eating.   . Don't lie down after a meal. Wait at least three to four hours after eating before going to bed, and don't lie down right after eating.   Alternative medicine . Several home remedies exist for treating GERD, but they provide only temporary relief. They include drinking baking soda (sodium bicarbonate)  added to water or drinking other fluids such as baking soda mixed with cream of tartar and water. . Although these liquids create temporary relief by neutralizing, washing away or buffering acids, eventually they aggravate the situation by adding gas and fluid to your stomach, increasing pressure and causing more acid reflux. Further, adding more sodium to your diet may increase your blood pressure and add stress to your heart, and excessive bicarbonate ingestion can alter the acid-base balance in your body.

## 2019-04-09 NOTE — Telephone Encounter (Signed)
Pt is aware.  

## 2019-06-22 DIAGNOSIS — Z23 Encounter for immunization: Secondary | ICD-10-CM | POA: Diagnosis not present

## 2019-07-02 ENCOUNTER — Encounter: Payer: Self-pay | Admitting: Gastroenterology

## 2019-11-07 DIAGNOSIS — E7800 Pure hypercholesterolemia, unspecified: Secondary | ICD-10-CM | POA: Diagnosis not present

## 2019-11-07 DIAGNOSIS — E440 Moderate protein-calorie malnutrition: Secondary | ICD-10-CM | POA: Diagnosis not present

## 2019-11-07 DIAGNOSIS — R69 Illness, unspecified: Secondary | ICD-10-CM | POA: Diagnosis not present

## 2019-11-07 DIAGNOSIS — Z79899 Other long term (current) drug therapy: Secondary | ICD-10-CM | POA: Diagnosis not present

## 2019-11-07 DIAGNOSIS — Z299 Encounter for prophylactic measures, unspecified: Secondary | ICD-10-CM | POA: Diagnosis not present

## 2019-11-07 DIAGNOSIS — Z681 Body mass index (BMI) 19 or less, adult: Secondary | ICD-10-CM | POA: Diagnosis not present

## 2019-11-07 DIAGNOSIS — R5383 Other fatigue: Secondary | ICD-10-CM | POA: Diagnosis not present

## 2019-11-07 DIAGNOSIS — Z1331 Encounter for screening for depression: Secondary | ICD-10-CM | POA: Diagnosis not present

## 2019-11-07 DIAGNOSIS — Z0000 Encounter for general adult medical examination without abnormal findings: Secondary | ICD-10-CM | POA: Diagnosis not present

## 2019-11-08 ENCOUNTER — Telehealth: Payer: Self-pay | Admitting: Gastroenterology

## 2019-11-08 ENCOUNTER — Encounter: Payer: Self-pay | Admitting: Gastroenterology

## 2019-11-08 NOTE — Telephone Encounter (Signed)
I'd be happy to see her in the office, can book a routine office visit with me. thanks

## 2019-11-08 NOTE — Telephone Encounter (Signed)
Dr. Havery Moros, this pt was referred for IBS.  She is a current pt at [facility] but requested to transfer care to you.    Pt had a colonoscopy and EGD in 2017.  Pt reported that she has lost 23 lbs within the past eight months and does not feel that her condition is being treated.  Records can be viewed in Epic and notes from Dr. Manuella Ghazi will be sent to you for review.  Please advise scheduling.

## 2019-11-28 DIAGNOSIS — Z01419 Encounter for gynecological examination (general) (routine) without abnormal findings: Secondary | ICD-10-CM | POA: Diagnosis not present

## 2019-12-05 DIAGNOSIS — R69 Illness, unspecified: Secondary | ICD-10-CM | POA: Diagnosis not present

## 2019-12-05 DIAGNOSIS — Z299 Encounter for prophylactic measures, unspecified: Secondary | ICD-10-CM | POA: Diagnosis not present

## 2019-12-05 DIAGNOSIS — E7800 Pure hypercholesterolemia, unspecified: Secondary | ICD-10-CM | POA: Diagnosis not present

## 2019-12-05 DIAGNOSIS — E440 Moderate protein-calorie malnutrition: Secondary | ICD-10-CM | POA: Diagnosis not present

## 2019-12-07 ENCOUNTER — Encounter: Payer: Self-pay | Admitting: Gastroenterology

## 2019-12-07 ENCOUNTER — Ambulatory Visit (INDEPENDENT_AMBULATORY_CARE_PROVIDER_SITE_OTHER): Payer: 59 | Admitting: Gastroenterology

## 2019-12-07 VITALS — BP 106/64 | HR 70 | Temp 98.2°F | Ht 66.0 in | Wt 122.0 lb

## 2019-12-07 DIAGNOSIS — R112 Nausea with vomiting, unspecified: Secondary | ICD-10-CM

## 2019-12-07 DIAGNOSIS — R634 Abnormal weight loss: Secondary | ICD-10-CM | POA: Diagnosis not present

## 2019-12-07 DIAGNOSIS — R194 Change in bowel habit: Secondary | ICD-10-CM

## 2019-12-07 DIAGNOSIS — R109 Unspecified abdominal pain: Secondary | ICD-10-CM | POA: Diagnosis not present

## 2019-12-07 MED ORDER — POLYETHYLENE GLYCOL 3350 17 G PO PACK: 17.0000 g | PACK | Freq: Every day | ORAL | 0 refills | Status: AC

## 2019-12-07 MED ORDER — DICYCLOMINE HCL 10 MG PO CAPS: 10.0000 mg | ORAL_CAPSULE | Freq: Three times a day (TID) | ORAL | 0 refills | Status: AC | PRN

## 2019-12-07 NOTE — Progress Notes (Signed)
HPI :  39 year old female with a history of anxiety/depression, altered bowel habits, tobacco use, referred here by Russella Dar for a second opinion regarding multiple GI tract symptoms.   The patient states for the past few years she has been having episodic problems with her bowels as well as nausea vomiting.  Since at least May 2020 she has had worsening symptoms.  She states her symptoms are very episodic.  She will wake up in the middle the night with multiple bowel movements and then she will start having nausea and vomiting.  She will have multiple episodes of vomiting and loose stools, this will last for at least half a day to 24 hours and then symptoms will eventually resolve on their own.  In between these episodes she has absolutely no problems with eating or her bowels.  At baseline she is actually somewhat constipated, having 1-2 bowel movements a week.  She eats well and has nausea nausea vomiting at baseline.  No reflux or dysphagia.  She denies any abdominal pain normally.  When she has these episodes she has abdominal pain associated with multiple loose stools.  She states she has tried altering her diet and it does not matter what she eats the symptoms occur periodically.  She no longer has menses due to IUD in place.  She was given some Phenergan which helps during the episodes but symptoms still persist through it.  She gets headaches from taking Zofran so does not take that.  She has been on Protonix 40 mg once a day but has not prevented episodes.  She has been on Paxil 20 mg a day and recently increase that to 40 mg a day.  She does not drink any alcohol but does smoke a pack cigarettes a day.  She denies any marijuana use.  She works as a Merchandiser, retail.  She states her mood is good right now, no issues with anxiety or depression.  She denies any migraine headaches with these episodes.  She was previously seen by Dr. Lovey Newcomer fields for the symptoms in 2017.  She had a colonoscopy  showing a redundant left colon and internal hemorrhoids.  Otherwise normal exam and biopsies were negative for microscopic colitis.  Hemorrhoids were thought to be the cause of rectal bleeding at the time.  Apparently she had an EGD performed at the same time as well although the patient was quite combative and the exam was very limited although the pyloric channel was patent, only mild gastritis noted and normal duodenum.  She states she got married in February and since that time she is lost about 20 pounds in the past few months.  Of note since she has increased her Paxil to 40 mg over the past month she has not had an episode.  Labs in February show normal CBC and thyroid as well as renal function and LFTs.  She denies any routine NSAID use.   Labs: 11/07/19: WBC 6.5, Hgb 12.8, HCT 37.6, plt 163 TSH 1.91 BUN 9, Cr 0.71 LFTs normal  Colonoscopy 09/08/16 - Dr. Oneida Alar The examined portion of the ileum was normal. - Redundant LEFT colon. - RECTAL BLEEDING DUE TO Internal hemorrhoids. -NO SOURCE FOR ABDOMINAL PAIN, NAUSEA, VOMITING, OR DIARRHEA IDENTIFIED. IT IS MOST LIKELY DUE TO MEDS OR IBS-D.  Biopsies negative for microscopic colitis  EGD 09/08/16 - limited exam (2 minutes) due to agitation, gastritis, normal duodenum reportedly    Past Medical History:  Diagnosis Date  . Anxiety and  depression   . Hemangioma of liver 11/23/2015   CT scan of abdomen: cavernous hemangioma dome of liver  . Tachycardia      Past Surgical History:  Procedure Laterality Date  . COLONOSCOPY N/A 09/08/2016   Dr. Oneida Alar: internal hemorrhoids, benign colonic mucosa  . ESOPHAGOGASTRODUODENOSCOPY N/A 09/08/2016   Dr. Oneida Alar: normal esophagus, gastritis, normal examined duodenum   Family History  Problem Relation Age of Onset  . Hypertension Mother   . Rheum arthritis Father        rheumatoid arthritis  . Colon cancer Neg Hx   . Inflammatory bowel disease Neg Hx    Social History   Tobacco  Use  . Smoking status: Current Every Day Smoker    Types: Cigarettes    Start date: 08/17/2016    Last attempt to quit: 08/17/2016    Years since quitting: 3.3  . Smokeless tobacco: Never Used  Substance Use Topics  . Alcohol use: Yes    Comment: occ  . Drug use: No   Current Outpatient Medications  Medication Sig Dispense Refill  . PARoxetine (PAXIL) 40 MG tablet Take 40 mg by mouth every morning.     No current facility-administered medications for this visit.   Allergies  Allergen Reactions  . Atenolol Other (See Comments)    fatigue     Review of Systems: All systems reviewed and negative except where noted in HPI.    Labs per HPI  Physical Exam: BP 106/64   Pulse 70   Temp 98.2 F (36.8 C)   Ht 5\' 6"  (1.676 m)   Wt 122 lb (55.3 kg)   BMI 19.69 kg/m  Constitutional: Pleasant,well-developed, female in no acute distress. HEENT: Normocephalic and atraumatic. Conjunctivae are normal. No scleral icterus. Neck supple.  Cardiovascular: Normal rate, regular rhythm.  Pulmonary/chest: Effort normal and breath sounds normal. No wheezing, rales or rhonchi. Abdominal: Soft, nondistended, nontender. . There are no masses palpable.  Extremities: no edema Lymphadenopathy: No cervical adenopathy noted. Neurological: Alert and oriented to person place and time. Skin: Skin is warm and dry. No rashes noted. Psychiatric: Normal mood and affect. Behavior is normal.   ASSESSMENT AND PLAN: 39 year old female here for second opinion/consultation regarding the following:  Episodic intractable nausea and vomiting / weight loss / abdominal pain / diarrhea - history as outlined above.  She had some work-up remotely for the symptoms in 2017 however symptoms as she reports to me today at least for the past year have been extremely episodic.  One episode per month as outlined that last about 24 hours and then resolves.  Her lab work-up and prior endoscopic work-up has been benign.  We  discussed differential diagnosis.  I do not think she has irritable bowel syndrome based on the episodic nature as she describes symptoms today.  It is possible she could have cyclical vomiting syndrome although her loose stools are not so consistent with this.  Given her weight loss, I am recommending CT scan of the abdomen pelvis with contrast to ensure no small bowel inflammation and rule out other pathology as she has never had cross-sectional imaging.  I gave her some Bentyl to use as needed for cramps and pains if she has another episode.  She should continue taking Phenergan at onset of symptoms.  She feels better with higher dose of Paxil and states her depression anxiety is well controlled at this time, perhaps that will help reduce severity and frequency of symptoms if this is more of  a functional disorder.  In the interim while we await her imaging recommend use MiraLAX daily and titrate as needed for her baseline constipation.  She was agreed with the plan, all questions answered.  Northlake Cellar, MD [facility]  CC: Monico Blitz, MD

## 2019-12-07 NOTE — Patient Instructions (Signed)
If you are age 39 or older, your body mass index should be between 23-30. Your Body mass index is 19.69 kg/m. If this is out of the aforementioned range listed, please consider follow up with your Primary Care Provider.  If you are age 39 or younger, your body mass index should be between 19-25. Your Body mass index is 19.69 kg/m. If this is out of the aformentioned range listed, please consider follow up with your Primary Care Provider.   You have been scheduled for a CT scan of the abdomen and pelvis at [facility] ([address]---this is in the same building as [employer]).   You are scheduled on Tuesday, 12-11-19 at 2:30pm. You should arrive 15 minutes prior to your appointment time for registration. Please follow the written instructions below on the day of your exam:  WARNING: IF YOU ARE ALLERGIC TO IODINE/X-RAY DYE, PLEASE NOTIFY RADIOLOGY IMMEDIATELY AT [phone]! YOU WILL BE GIVEN A 13 HOUR PREMEDICATION PREP.  1) Do not eat anything after 10:30pm (4 hours prior to your test) 2) You have been given 2 bottles of oral contrast to drink. The solution may taste better if refrigerated, but do NOT add ice or any other liquid to this solution. Shake well before drinking.    Drink 1 bottle of contrast @ 12:30pm (2 hours prior to your exam)  Drink 1 bottle of contrast @ 1:30pm (1 hour prior to your exam)  You may take any medications as prescribed with a small amount of water, if necessary. If you take any of the following medications: METFORMIN, GLUCOPHAGE, GLUCOVANCE, AVANDAMET, RIOMET, FORTAMET, [place] MET, JANUMET, GLUMETZA or METAGLIP, you MAY be asked to HOLD this medication 48 hours AFTER the exam.  The purpose of you drinking the oral contrast is to aid in the visualization of your intestinal tract. The contrast solution may cause some diarrhea. Depending on your individual set of symptoms, you may also receive an intravenous injection of x-ray  contrast/dye. Plan on being at [facility] for 30 minutes or longer, depending on the type of exam you are having performed.  This test typically takes 30-45 minutes to complete.  If you have any questions regarding your exam or if you need to reschedule, you may call the CT department at [phone] between the hours of 8:00 am and 5:00 pm, Monday-Friday.  ________________________________________________________________________  We have sent the following medications to your pharmacy for you to pick up at your convenience:   Bentyl 75m: Take one to two tablets every 8 hours as needed.  Continue Protonix 465monce daily.  Please purchase the following medications over the counter and take as directed: Miralax: Take as directed once daily and titrate as needed  Thank you for entrusting me with your care and for choosing [facility], Dr. StCarolina Cellar

## 2019-12-11 ENCOUNTER — Ambulatory Visit (INDEPENDENT_AMBULATORY_CARE_PROVIDER_SITE_OTHER)
Admission: RE | Admit: 2019-12-11 | Discharge: 2019-12-11 | Disposition: A | Discharge status: Home / Self Care | Payer: 59 | Source: Ambulatory Visit | Attending: Gastroenterology | Admitting: Gastroenterology

## 2019-12-11 ENCOUNTER — Other Ambulatory Visit: Payer: Self-pay

## 2019-12-11 DIAGNOSIS — R109 Unspecified abdominal pain: Secondary | ICD-10-CM | POA: Diagnosis not present

## 2019-12-11 DIAGNOSIS — R112 Nausea with vomiting, unspecified: Secondary | ICD-10-CM

## 2019-12-11 DIAGNOSIS — R194 Change in bowel habit: Secondary | ICD-10-CM

## 2019-12-11 DIAGNOSIS — R634 Abnormal weight loss: Secondary | ICD-10-CM

## 2019-12-11 DIAGNOSIS — R1110 Vomiting, unspecified: Secondary | ICD-10-CM | POA: Diagnosis not present

## 2019-12-11 MED ORDER — IOHEXOL 300 MG/ML  SOLN
100.0000 mL | Freq: Once | INTRAMUSCULAR | Status: AC | PRN
  Administered 2019-12-11: 15:00:00 100 mL via INTRAVENOUS

## 2019-12-31 DIAGNOSIS — K561 Intussusception: Secondary | ICD-10-CM | POA: Diagnosis not present

## 2020-01-08 ENCOUNTER — Other Ambulatory Visit: Payer: Self-pay | Admitting: Surgery

## 2020-01-08 ENCOUNTER — Telehealth: Payer: Self-pay | Admitting: Gastroenterology

## 2020-01-08 DIAGNOSIS — K561 Intussusception: Secondary | ICD-10-CM

## 2020-01-08 MED ORDER — PROMETHAZINE HCL 25 MG PO TABS: 25.0000 mg | ORAL_TABLET | Freq: Four times a day (QID) | ORAL | 0 refills | Status: AC | PRN

## 2020-01-08 NOTE — Telephone Encounter (Signed)
Patient called states she is having another episode of nausea and vomiting please advise

## 2020-01-08 NOTE — Telephone Encounter (Signed)
Patient with a hx of intussusception and has [facility] for this .  They have ordered her a MRI enterography but has not been scheduled yet.  She has been vomiting since yesterday.  She is out of phenergan, took the last one at 2:00 am.   I have refilled this for her. She is encouraged to call the surgeon and let them know she is very symptomatic now and possibly they can expedite her workup and possibly obtain imaging now during her episode.  She will call back for any additional questions or concerns.

## 2020-01-08 NOTE — Telephone Encounter (Signed)
Thank you Barbera Setters I agree with your recommendations.

## 2020-01-09 ENCOUNTER — Other Ambulatory Visit: Payer: Self-pay | Admitting: Surgery

## 2020-01-09 DIAGNOSIS — K561 Intussusception: Secondary | ICD-10-CM

## 2020-02-04 ENCOUNTER — Other Ambulatory Visit: Payer: 59

## 2020-02-04 ENCOUNTER — Inpatient Hospital Stay: Admission: RE | Admit: 2020-02-04 | Payer: 59 | Source: Ambulatory Visit

## 2020-02-06 ENCOUNTER — Ambulatory Visit
Admission: RE | Admit: 2020-02-06 | Discharge: 2020-02-06 | Disposition: A | Discharge status: Home / Self Care | Payer: 59 | Source: Ambulatory Visit | Attending: Surgery | Admitting: Surgery

## 2020-02-06 ENCOUNTER — Other Ambulatory Visit: Payer: Self-pay

## 2020-02-06 DIAGNOSIS — K561 Intussusception: Secondary | ICD-10-CM

## 2020-02-06 DIAGNOSIS — K625 Hemorrhage of anus and rectum: Secondary | ICD-10-CM

## 2020-02-06 MED ORDER — GADOBENATE DIMEGLUMINE 529 MG/ML IV SOLN: 12.0000 mL | Freq: Once | INTRAVENOUS | Status: DC | PRN

## 2020-02-06 MED ORDER — GLUCAGON HCL RDNA (DIAGNOSTIC) 1 MG IJ SOLR
1.0000 mg | Freq: Once | INTRAMUSCULAR | Status: AC
  Administered 2020-02-06: 1 mg via INTRAMUSCULAR

## 2020-02-06 MED ORDER — GADOBENATE DIMEGLUMINE 529 MG/ML IV SOLN
11.0000 mL | Freq: Once | INTRAVENOUS | Status: AC | PRN
  Administered 2020-02-06: 11 mL via INTRAVENOUS

## 2020-02-06 MED ORDER — GLUCAGON HCL RDNA (DIAGNOSTIC) 1 MG IJ SOLR: 1.0000 mg | Freq: Once | INTRAMUSCULAR | Status: DC | PRN

## 2020-02-27 ENCOUNTER — Telehealth: Payer: Self-pay | Admitting: Gastroenterology

## 2020-02-27 NOTE — Telephone Encounter (Signed)
Patient notified  She will call back if the vomiting returns.

## 2020-02-27 NOTE — Telephone Encounter (Signed)
I reviewed MRE findings, no obvious small bowel pathology, not sure what Dr. Dema Severin had recommended regarding the prior CT findings, I'm assuming he is holding off on surgery for now. If she is still having a lot of vomiting would recommend EGD as the last one she had with Dr. Oneida Alar was incomplete. She also needs a follow up MRI of her liver in 3 months per radiology recommendations given lesions noted in the liver, which are likely benign hemangiomas.

## 2020-02-27 NOTE — Telephone Encounter (Signed)
Please review the MRI ordered by Dr. Dema Severin. Patient was asked to reach out to you to to see if she needs any additional testing from you?  Colonoscopy?

## 2020-03-05 ENCOUNTER — Other Ambulatory Visit: Payer: 59

## 2020-05-30 ENCOUNTER — Telehealth: Payer: Self-pay

## 2020-05-30 ENCOUNTER — Other Ambulatory Visit: Payer: Self-pay

## 2020-05-30 DIAGNOSIS — K769 Liver disease, unspecified: Secondary | ICD-10-CM

## 2020-05-30 NOTE — Telephone Encounter (Signed)
-----   Message from Marlon Pel, RN sent at 05/30/2020 10:24 AM EDT -----  ----- Message ----- From: Marlon Pel, RN Sent: 05/29/2020 To: Marlon Pel, RN  Needs MRI follow up hemangiomas.  Arm

## 2020-05-30 NOTE — Telephone Encounter (Signed)
Order in epic for MRI liver with and without contrast at [facility], contacted [place], they stated that they will contact the patient because they have to ask screening questions, they will contact her today.

## 2020-06-05 DIAGNOSIS — R69 Illness, unspecified: Secondary | ICD-10-CM | POA: Diagnosis not present

## 2020-06-05 DIAGNOSIS — Z299 Encounter for prophylactic measures, unspecified: Secondary | ICD-10-CM | POA: Diagnosis not present

## 2020-06-05 DIAGNOSIS — E440 Moderate protein-calorie malnutrition: Secondary | ICD-10-CM | POA: Diagnosis not present

## 2020-06-05 DIAGNOSIS — U071 COVID-19: Secondary | ICD-10-CM | POA: Diagnosis not present

## 2020-06-12 DIAGNOSIS — Z299 Encounter for prophylactic measures, unspecified: Secondary | ICD-10-CM | POA: Diagnosis not present

## 2020-06-12 DIAGNOSIS — U071 COVID-19: Secondary | ICD-10-CM | POA: Diagnosis not present

## 2020-06-12 DIAGNOSIS — E440 Moderate protein-calorie malnutrition: Secondary | ICD-10-CM | POA: Diagnosis not present

## 2020-06-12 DIAGNOSIS — R69 Illness, unspecified: Secondary | ICD-10-CM | POA: Diagnosis not present

## 2020-06-20 ENCOUNTER — Other Ambulatory Visit: Payer: Self-pay

## 2020-06-20 ENCOUNTER — Ambulatory Visit
Admission: RE | Admit: 2020-06-20 | Discharge: 2020-06-20 | Disposition: A | Discharge status: Home / Self Care | Payer: 59 | Source: Ambulatory Visit | Attending: Gastroenterology | Admitting: Gastroenterology

## 2020-06-20 DIAGNOSIS — D1809 Hemangioma of other sites: Secondary | ICD-10-CM | POA: Diagnosis not present

## 2020-06-20 DIAGNOSIS — K769 Liver disease, unspecified: Secondary | ICD-10-CM

## 2020-06-20 MED ORDER — GADOBENATE DIMEGLUMINE 529 MG/ML IV SOLN
11.0000 mL | Freq: Once | INTRAVENOUS | Status: AC | PRN
  Administered 2020-06-20: 11 mL via INTRAVENOUS

## 2020-11-12 DIAGNOSIS — Z0000 Encounter for general adult medical examination without abnormal findings: Secondary | ICD-10-CM | POA: Diagnosis not present

## 2020-11-12 DIAGNOSIS — Z1331 Encounter for screening for depression: Secondary | ICD-10-CM | POA: Diagnosis not present

## 2020-11-12 DIAGNOSIS — Z2821 Immunization not carried out because of patient refusal: Secondary | ICD-10-CM | POA: Diagnosis not present

## 2020-11-12 DIAGNOSIS — R69 Illness, unspecified: Secondary | ICD-10-CM | POA: Diagnosis not present

## 2020-11-12 DIAGNOSIS — Z6820 Body mass index (BMI) 20.0-20.9, adult: Secondary | ICD-10-CM | POA: Diagnosis not present

## 2020-11-12 DIAGNOSIS — Z299 Encounter for prophylactic measures, unspecified: Secondary | ICD-10-CM | POA: Diagnosis not present

## 2020-11-12 DIAGNOSIS — Z79899 Other long term (current) drug therapy: Secondary | ICD-10-CM | POA: Diagnosis not present

## 2021-06-21 IMAGING — MR MR ABDOMEN WO/W CM
11 of 17 series · 28 of 48 positions shown · IV contrast (multihance)
Comparison: 02/06/2020

CLINICAL DATA: Follow-up indeterminate liver lesions.

EXAM:
MRI ABDOMEN WITHOUT AND WITH CONTRAST
TECHNIQUE: Multiplanar multisequence MR imaging of the abdomen was performed
both before and after the administration of intravenous contrast.
CONTRAST:  11mL MULTIHANCE GADOBENATE DIMEGLUMINE 529 MG/ML IV SOLN

[Series 3: cor haste · coronal · 5.0mm · 0.68mm/px · 2 of 27 slices shown]
[im 1/27]
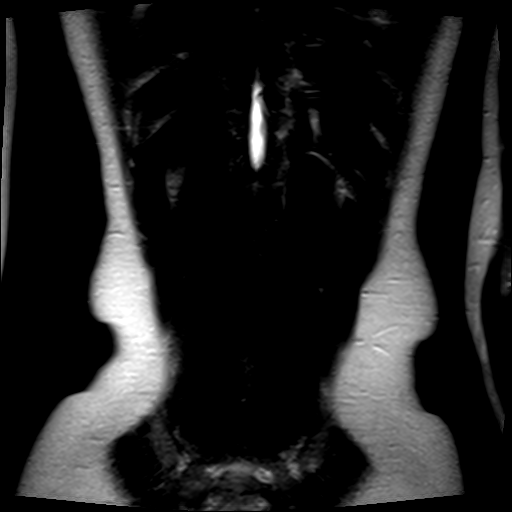
[im 27/27]
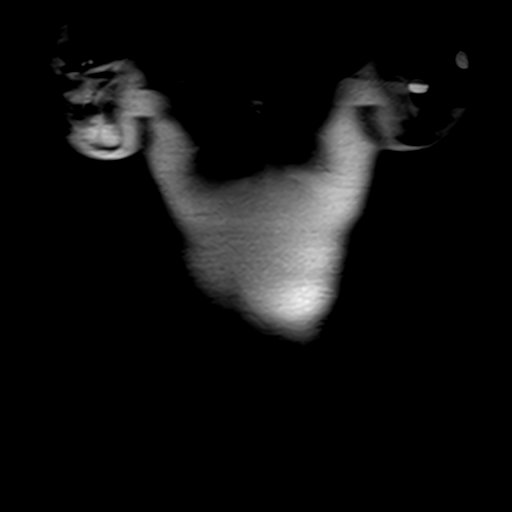

[Series 4: axial haste · axial · 6.0mm · 0.68mm/px · z∈[-98,+113]mm · 2 of 33 slices shown]
[im 1/33]
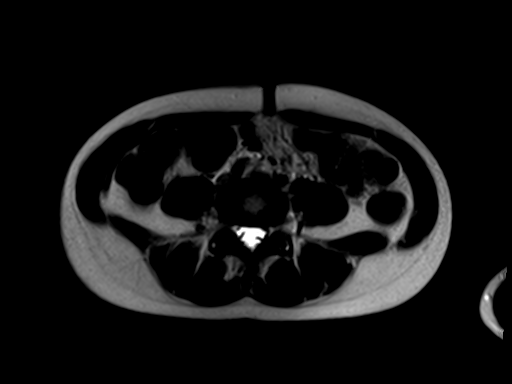
[im 33/33]
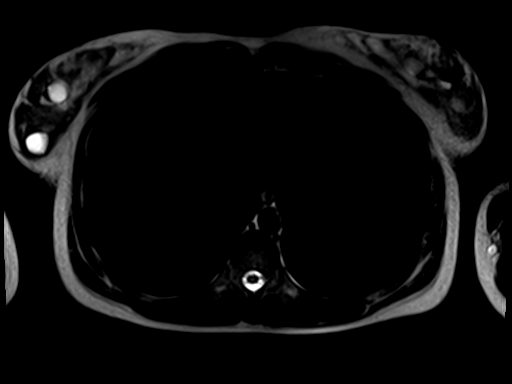

[Series 5: T1 · axial · 6.0mm · 0.68mm/px · z∈[-91,+120]mm · 4 of 66 slices shown]
[im 1/66]
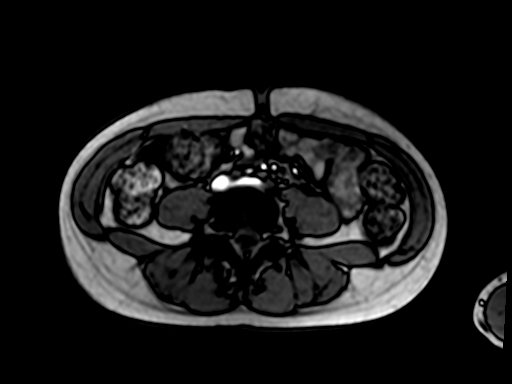
[im 22/66]
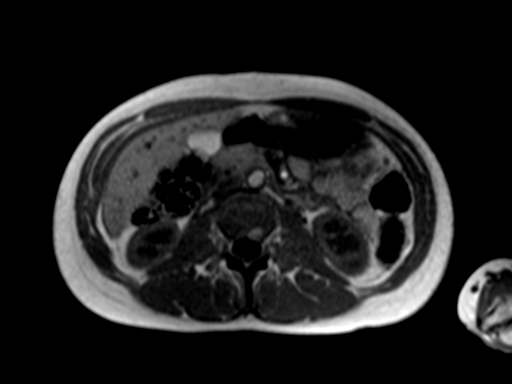
[im 44/66]
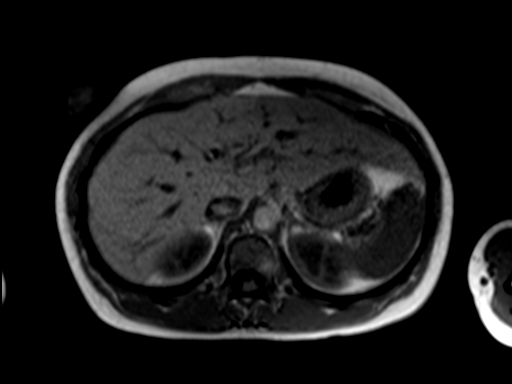
[im 66/66]
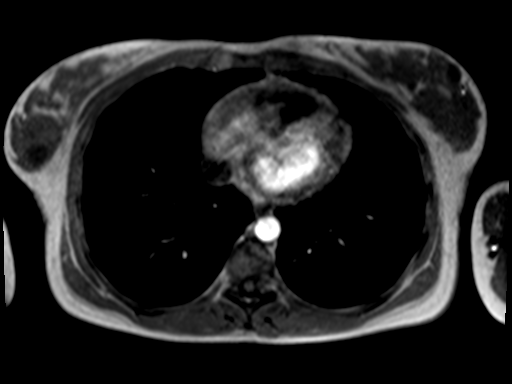

[Series 6: bSSFP · axial · 4.0mm · 0.68mm/px · z∈[-134,+106]mm · 4 of 61 slices shown]
[im 1/61]
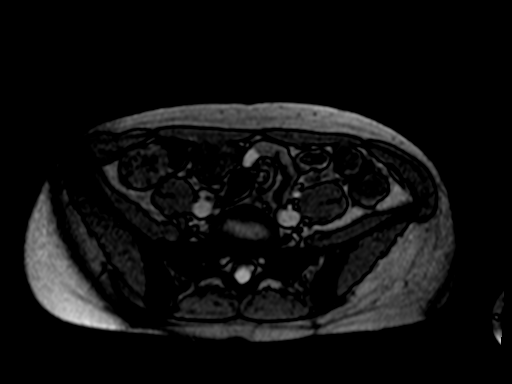
[im 21/61]
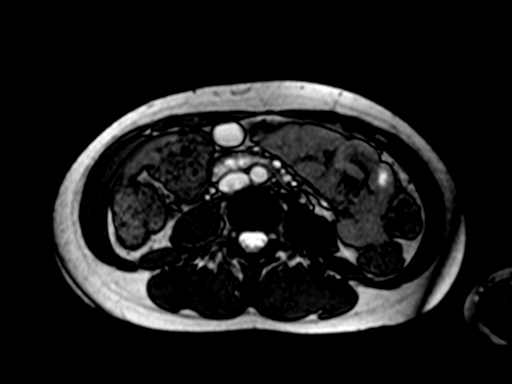
[im 41/61]
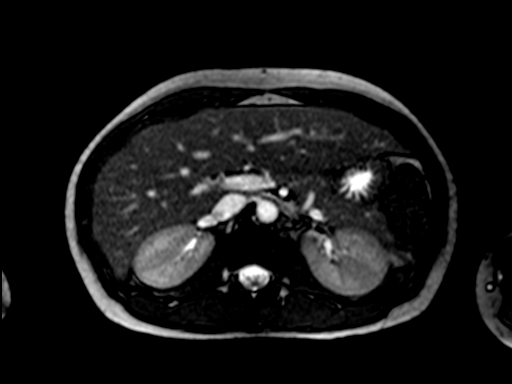
[im 61/61]
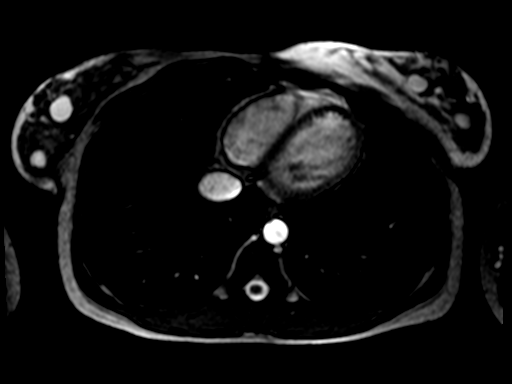

[Series 7: T2 · axial · 6.0mm · 1.09mm/px · 1 of 32 slices shown]
[im 1/32]
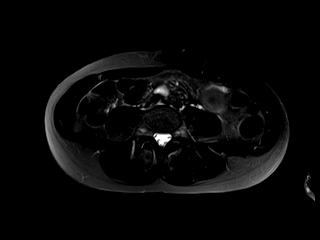

[Series 8: [id number]_trig · axial · 6.0mm · 1.82mm/px · z∈[-83,+141]mm · 4 of 96 slices shown]
[im 1/96]
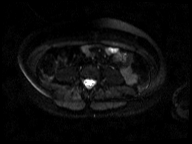
[im 32/96]
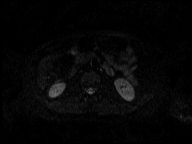
[im 64/96]
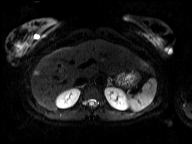
[im 96/96]
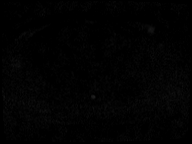

[Series 9: [id number]_trig_adc · axial · 6.0mm · 1.82mm/px · 1 of 32 slices shown]
[im 1/32]
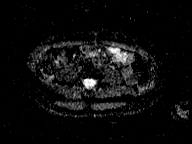

[Series 10: T1 dynamic · axial · non-contrast · 2.5mm · 0.74mm/px · z∈[-89,+109]mm · 3 of 80 slices shown]
[im 1/80]
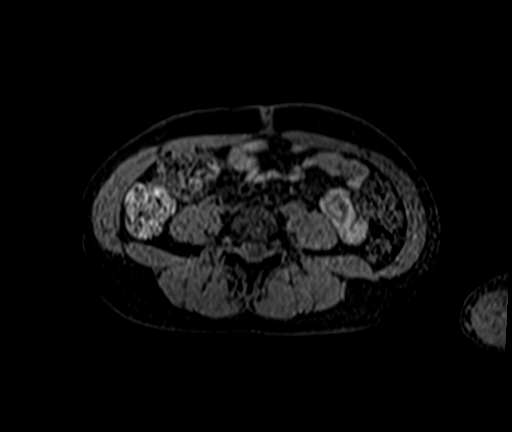
[im 40/80]
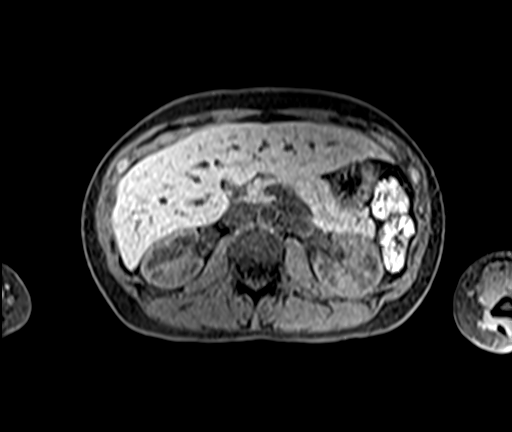
[im 80/80]
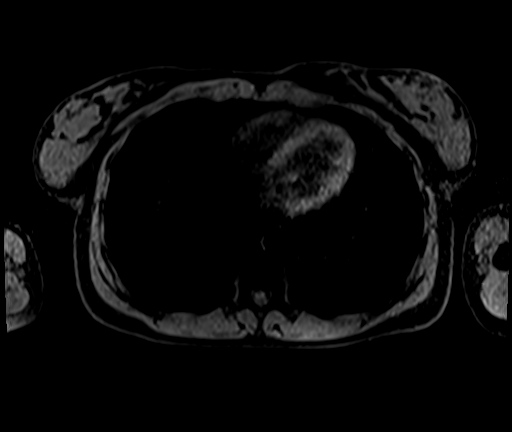

[Series 11: T1 dynamic post-contrast · axial · 2.5mm · 0.74mm/px · z∈[-89,+109]mm · 3 of 80 slices shown (1 of 3)]
[im 1/80]
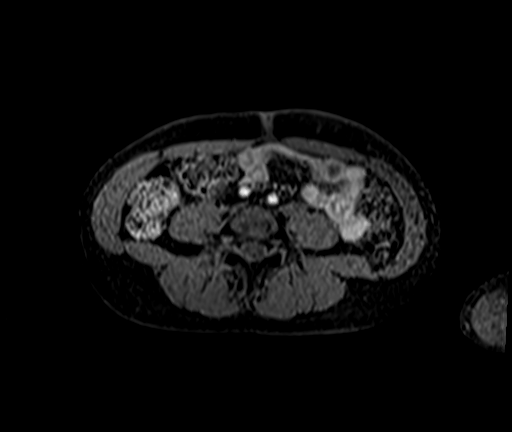
[im 40/80]
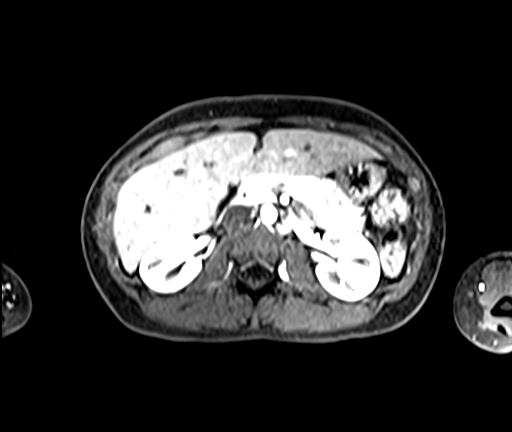
[im 80/80]
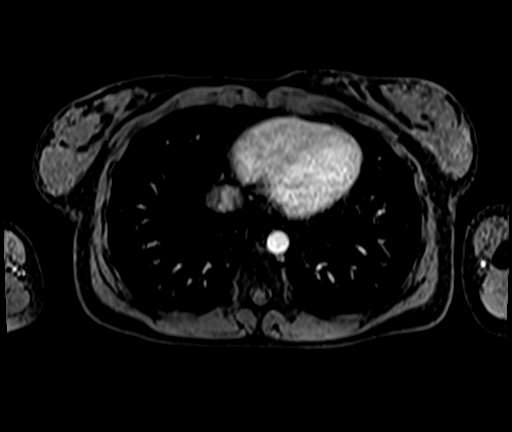

[Series 12: T1 dynamic post-contrast · axial · 2.5mm · 0.74mm/px · z∈[-89,+109]mm · 3 of 80 slices shown (2 of 3)]
[im 1/80]
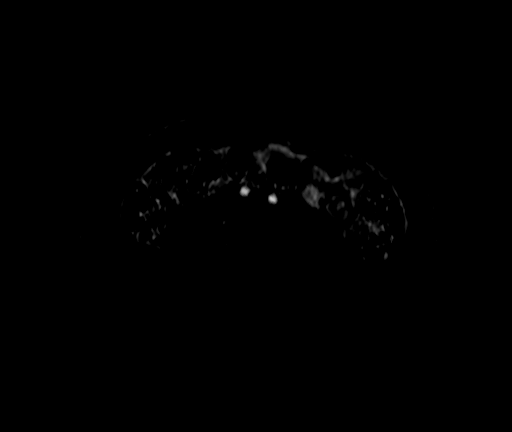
[im 40/80]
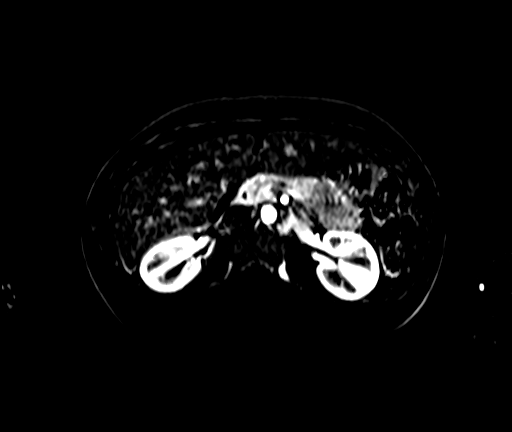
[im 80/80]
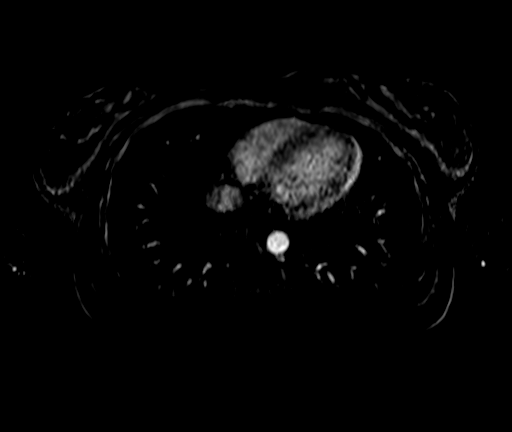

[Series 13: T1 dynamic post-contrast · axial · 2.5mm · 0.74mm/px · 1 of 80 slices shown (3 of 3)]
[im 1/80]
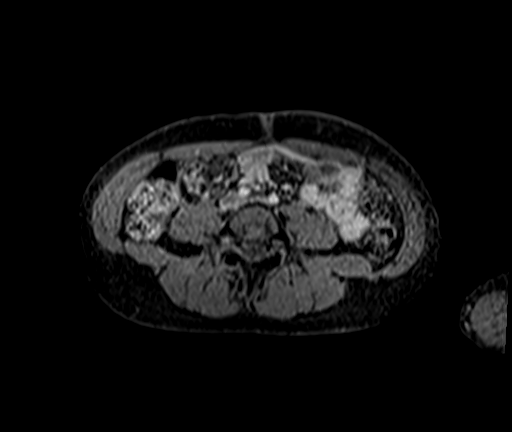

[28 of 48 positions shown; findings below may reference images not displayed]

FINDINGS: Lower chest: No acute findings.

Hepatobiliary: 2 benign hemangiomas are seen in the dome of the
right hepatic lobe, largest measuring 2.4 cm. A sub-cm hemangioma is
also seen in the caudate lobe. No other liver masses are identified.

Pancreas:  No mass or inflammatory changes.

Spleen:  Within normal limits in size and appearance.

Adrenals/Urinary Tract: No masses identified. Tiny cyst noted in the
lower pole the right kidney. No evidence of hydronephrosis.

Stomach/Bowel: Visualized portion unremarkable.

Vascular/Lymphatic: No pathologically enlarged lymph nodes
identified. No abdominal aortic aneurysm.

Other:  None.

Musculoskeletal:  No suspicious bone lesions identified.
IMPRESSION: Several small benign hepatic hemangiomas. No evidence of malignancy
or other significant abnormality.

## 2022-01-21 ENCOUNTER — Other Ambulatory Visit: Payer: Self-pay

## 2022-01-21 ENCOUNTER — Other Ambulatory Visit: Payer: Self-pay | Admitting: Internal Medicine

## 2022-01-21 DIAGNOSIS — Z1231 Encounter for screening mammogram for malignant neoplasm of breast: Secondary | ICD-10-CM

## 2022-02-24 ENCOUNTER — Ambulatory Visit
Admission: RE | Admit: 2022-02-24 | Discharge: 2022-02-24 | Disposition: A | Discharge status: Home / Self Care | Payer: Self-pay | Source: Ambulatory Visit | Attending: Internal Medicine | Admitting: Internal Medicine

## 2022-02-24 DIAGNOSIS — Z1231 Encounter for screening mammogram for malignant neoplasm of breast: Secondary | ICD-10-CM

## 2023-06-16 ENCOUNTER — Encounter: Payer: Self-pay | Admitting: Internal Medicine

## 2023-06-16 DIAGNOSIS — Z1231 Encounter for screening mammogram for malignant neoplasm of breast: Secondary | ICD-10-CM
